# Patient Record
Sex: Female | Born: 1965 | Race: Black or African American | Hispanic: No | Marital: Single | Smoking: Never smoker
Health system: Southern US, Community
[De-identification: ages and names within clinical notes are randomized; demographics above are authoritative.]

## PROBLEM LIST (undated history)

## (undated) DIAGNOSIS — K219 Gastro-esophageal reflux disease without esophagitis: Secondary | ICD-10-CM

## (undated) DIAGNOSIS — J329 Chronic sinusitis, unspecified: Secondary | ICD-10-CM

## (undated) DIAGNOSIS — D649 Anemia, unspecified: Secondary | ICD-10-CM

## (undated) DIAGNOSIS — Z1371 Encounter for nonprocreative screening for genetic disease carrier status: Secondary | ICD-10-CM

## (undated) DIAGNOSIS — K449 Diaphragmatic hernia without obstruction or gangrene: Secondary | ICD-10-CM

## (undated) DIAGNOSIS — R053 Chronic cough: Secondary | ICD-10-CM

## (undated) DIAGNOSIS — Z803 Family history of malignant neoplasm of breast: Secondary | ICD-10-CM

## (undated) HISTORY — DX: Chronic sinusitis, unspecified: J32.9

## (undated) HISTORY — DX: Encounter for nonprocreative screening for genetic disease carrier status: Z13.71

## (undated) HISTORY — DX: Anemia, unspecified: D64.9

## (undated) HISTORY — DX: Chronic cough: R05.3

## (undated) HISTORY — DX: Gastro-esophageal reflux disease without esophagitis: K21.9

## (undated) HISTORY — DX: Family history of malignant neoplasm of breast: Z80.3

## (undated) HISTORY — PX: DILATION AND CURETTAGE OF UTERUS: SHX78

## (undated) HISTORY — DX: Diaphragmatic hernia without obstruction or gangrene: K44.9

## (undated) HISTORY — PX: BREAST CYST ASPIRATION: SHX578

---

## 2000-10-28 ENCOUNTER — Ambulatory Visit (HOSPITAL_COMMUNITY): Admission: RE | Admit: 2000-10-28 | Discharge: 2000-10-28 | Payer: Self-pay | Admitting: Obstetrics and Gynecology

## 2000-10-28 ENCOUNTER — Encounter: Payer: Self-pay | Admitting: Obstetrics and Gynecology

## 2000-12-24 ENCOUNTER — Encounter: Payer: Self-pay | Admitting: Obstetrics and Gynecology

## 2000-12-24 ENCOUNTER — Ambulatory Visit (HOSPITAL_COMMUNITY): Admission: RE | Admit: 2000-12-24 | Discharge: 2000-12-24 | Payer: Self-pay | Admitting: Obstetrics and Gynecology

## 2000-12-30 ENCOUNTER — Encounter: Payer: Self-pay | Admitting: Obstetrics and Gynecology

## 2000-12-30 ENCOUNTER — Inpatient Hospital Stay (HOSPITAL_COMMUNITY): Admission: AD | Admit: 2000-12-30 | Discharge: 2000-12-30 | Payer: Self-pay | Admitting: Obstetrics and Gynecology

## 2001-01-05 ENCOUNTER — Encounter: Payer: Self-pay | Admitting: Obstetrics and Gynecology

## 2001-01-05 ENCOUNTER — Ambulatory Visit (HOSPITAL_COMMUNITY): Admission: RE | Admit: 2001-01-05 | Discharge: 2001-01-05 | Payer: Self-pay | Admitting: Obstetrics and Gynecology

## 2001-01-08 ENCOUNTER — Inpatient Hospital Stay (HOSPITAL_COMMUNITY): Admission: AD | Admit: 2001-01-08 | Discharge: 2001-01-08 | Payer: Self-pay | Admitting: Obstetrics and Gynecology

## 2001-01-19 ENCOUNTER — Encounter: Payer: Self-pay | Admitting: Obstetrics and Gynecology

## 2001-01-19 ENCOUNTER — Ambulatory Visit (HOSPITAL_COMMUNITY): Admission: RE | Admit: 2001-01-19 | Discharge: 2001-01-19 | Payer: Self-pay | Admitting: Obstetrics and Gynecology

## 2001-02-11 ENCOUNTER — Encounter: Payer: Self-pay | Admitting: Obstetrics and Gynecology

## 2001-02-11 ENCOUNTER — Ambulatory Visit (HOSPITAL_COMMUNITY): Admission: RE | Admit: 2001-02-11 | Discharge: 2001-02-11 | Payer: Self-pay | Admitting: Obstetrics and Gynecology

## 2001-03-04 ENCOUNTER — Encounter: Payer: Self-pay | Admitting: Obstetrics and Gynecology

## 2001-03-04 ENCOUNTER — Ambulatory Visit (HOSPITAL_COMMUNITY): Admission: RE | Admit: 2001-03-04 | Discharge: 2001-03-04 | Payer: Self-pay | Admitting: Obstetrics and Gynecology

## 2001-03-17 ENCOUNTER — Inpatient Hospital Stay (HOSPITAL_COMMUNITY): Admission: AD | Admit: 2001-03-17 | Discharge: 2001-03-21 | Payer: Self-pay | Admitting: Obstetrics and Gynecology

## 2001-03-18 ENCOUNTER — Encounter: Payer: Self-pay | Admitting: Obstetrics and Gynecology

## 2009-10-26 ENCOUNTER — Emergency Department: Payer: Self-pay | Admitting: Emergency Medicine

## 2011-12-27 ENCOUNTER — Ambulatory Visit: Payer: Self-pay | Admitting: Unknown Physician Specialty

## 2012-04-14 ENCOUNTER — Ambulatory Visit: Payer: Self-pay

## 2012-05-13 ENCOUNTER — Ambulatory Visit: Payer: Self-pay

## 2013-01-14 HISTORY — PX: UPPER GI ENDOSCOPY: SHX6162

## 2014-04-07 ENCOUNTER — Encounter: Payer: Self-pay | Admitting: General Surgery

## 2014-04-07 ENCOUNTER — Ambulatory Visit (INDEPENDENT_AMBULATORY_CARE_PROVIDER_SITE_OTHER): Payer: 59 | Admitting: General Surgery

## 2014-04-07 VITALS — BP 124/68 | HR 70 | Resp 12 | Ht 62.0 in | Wt 145.0 lb

## 2014-04-07 DIAGNOSIS — Z1211 Encounter for screening for malignant neoplasm of colon: Secondary | ICD-10-CM | POA: Diagnosis not present

## 2014-04-07 MED ORDER — POLYETHYLENE GLYCOL 3350 17 GM/SCOOP PO POWD: ORAL | Status: DC

## 2014-04-07 NOTE — Patient Instructions (Addendum)
Colonoscopy A colonoscopy is an exam to look at the entire large intestine (colon). This exam can help find problems such as tumors, polyps, inflammation, and areas of bleeding. The exam takes about 1 hour.  LET [facility] CARE PROVIDER KNOW ABOUT:   Any allergies you have.  All medicines you are taking, including vitamins, herbs, eye drops, creams, and over-the-counter medicines.  Previous problems you or members of your family have had with the use of anesthetics.  Any blood disorders you have.  Previous surgeries you have had.  Medical conditions you have. RISKS AND COMPLICATIONS  Generally, this is a safe procedure. However, as with any procedure, complications can occur. Possible complications include:  Bleeding.  Tearing or rupture of the colon wall.  Reaction to medicines given during the exam.  Infection (rare). BEFORE THE PROCEDURE   Ask your health care provider about changing or stopping your regular medicines.  You may be prescribed an oral bowel prep. This involves drinking a large amount of medicated liquid, starting the day before your procedure. The liquid will cause you to have multiple loose stools until your stool is almost clear or light green. This cleans out your colon in preparation for the procedure.  Do not eat or drink anything else once you have started the bowel prep, unless your health care provider tells you it is safe to do so.  Arrange for someone to drive you home after the procedure. PROCEDURE   You will be given medicine to help you relax (sedative).  You will lie on your side with your knees bent.  A long, flexible tube with a light and camera on the end (colonoscope) will be inserted through the rectum and into the colon. The camera sends video back to a computer screen as it moves through the colon. The colonoscope also releases carbon dioxide gas to inflate the colon. This helps your health care provider see the area better.  During  the exam, your health care provider may take a small tissue sample (biopsy) to be examined under a microscope if any abnormalities are found.  The exam is finished when the entire colon has been viewed. AFTER THE PROCEDURE   Do not drive for 24 hours after the exam.  You may have a small amount of blood in your stool.  You may pass moderate amounts of gas and have mild abdominal cramping or bloating. This is caused by the gas used to inflate your colon during the exam.  Ask when your test results will be ready and how you will get your results. Make sure you get your test results. Document Released: 12/29/1999 Document Revised: 10/21/2012 Document Reviewed: 09/07/2012 [facility] Patient Information 2015 [place], [place]. This information is not intended to replace advice given to you by your health care provider. Make sure you discuss any questions you have with your health care provider.  Patient has been scheduled for a colonoscopy on 06-01-14 at [facility].

## 2014-04-07 NOTE — Progress Notes (Signed)
Patient ID: Sabrina Schneider, female   DOB: [birth year], 49 y.o.   MRN: [id number]  Chief Complaint  Patient presents with  . Colonoscopy    HPI Sabrina Schneider is a 49 y.o. female.  Here today to discuss having a colonoscopy. Patient states she has no GI problems at this time.  HPI  Past Medical History  Diagnosis Date  . Hiatal hernia     Past Surgical History  Procedure Laterality Date  . Breast cyst aspiration  2010  . Cesarean section  03/18/2001  . Upper gi endoscopy  2015    Dr. Vira Agar    Family History  Problem Relation Age of Onset  . Hypertension Mother   . Diabetes Mother   . Breast cancer Daughter     58    Social History History  Substance Use Topics  . Smoking status: Never Smoker   . Smokeless tobacco: Not on file  . Alcohol Use: 0.0 oz/week    0 Standard drinks or equivalent per week     Comment: occasionally    No Known Allergies  Current Outpatient Prescriptions  Medication Sig Dispense Refill  . polyethylene glycol powder (GLYCOLAX/MIRALAX) powder 255 grams one bottle for colonoscopy prep 255 g 0   No current facility-administered medications for this visit.    Review of Systems Review of Systems  Constitutional: Negative.   Respiratory: Negative.   Cardiovascular: Negative.   Gastrointestinal: Negative.     Blood pressure 124/68, pulse 70, resp. rate 12, height 5\' 2"  (1.575 m), weight 145 lb (65.772 kg), last menstrual period 03/24/2014.  Physical Exam Physical Exam  Constitutional: She is oriented to person, place, and time. She appears well-developed and well-nourished.  Eyes: Conjunctivae are normal. No scleral icterus.  Neck: Neck supple.  Cardiovascular: Normal rate, regular rhythm and normal heart sounds.   Pulmonary/Chest: Effort normal and breath sounds normal.  Abdominal: There is no tenderness.  Neurological: She is alert and oriented to person, place, and time.  Skin: Skin is warm and dry.    Data Reviewed Dr. Lucianne Lei Dalen's  notes of 11/23/2013  Assessment    Candidate for screening colonoscopy.    Plan    The risks and benefits of screening endoscopy exams were reviewed. Potential for injury of the intestine with bleeding or perforation discussed. Preparation reviewed with the patient by the staff.      Patient has been scheduled for a colonoscopy on 06-01-14 at [facility]  PCP:  No Pcp Per Patient Ref: Dr Carollee Leitz 04/08/2014, 6:22 AM

## 2014-04-08 ENCOUNTER — Other Ambulatory Visit: Payer: Self-pay | Admitting: General Surgery

## 2014-04-08 ENCOUNTER — Encounter: Payer: Self-pay | Admitting: General Surgery

## 2014-04-08 DIAGNOSIS — Z1211 Encounter for screening for malignant neoplasm of colon: Secondary | ICD-10-CM | POA: Insufficient documentation

## 2014-05-26 ENCOUNTER — Telehealth: Payer: Self-pay | Admitting: *Deleted

## 2014-05-26 NOTE — Telephone Encounter (Signed)
Patient states she has had no medication changes since last office visit.   This patient states she has yet to pick up her Miralax prescription and has yet to pre-register. Patient was instructed to pre-register no later than tomorrow, 05-27-14.  We will proceed with colonoscopy that is scheduled for 06-01-14 at [facility]. Patient instructed to call if she has further questions.

## 2014-05-31 ENCOUNTER — Encounter: Payer: Self-pay | Admitting: *Deleted

## 2014-06-01 ENCOUNTER — Ambulatory Visit: Payer: 59 | Admitting: Anesthesiology

## 2014-06-01 ENCOUNTER — Ambulatory Visit
Admission: RE | Admit: 2014-06-01 | Discharge: 2014-06-01 | Disposition: A | Discharge status: Home / Self Care | Payer: 59 | Source: Ambulatory Visit | Attending: General Surgery | Admitting: General Surgery

## 2014-06-01 ENCOUNTER — Encounter
Admission: RE | Disposition: A | Discharge status: Home / Self Care | Payer: Self-pay | Source: Ambulatory Visit | Attending: General Surgery

## 2014-06-01 DIAGNOSIS — Z1211 Encounter for screening for malignant neoplasm of colon: Secondary | ICD-10-CM | POA: Insufficient documentation

## 2014-06-01 HISTORY — PX: COLONOSCOPY: SHX5424

## 2014-06-01 LAB — POCT PREGNANCY, URINE: PREG TEST UR: NEGATIVE

## 2014-06-01 SURGERY — COLONOSCOPY
Anesthesia: General

## 2014-06-01 MED ORDER — FENTANYL CITRATE (PF) 100 MCG/2ML IJ SOLN
INTRAMUSCULAR | Status: DC | PRN
  Administered 2014-06-01: 50 ug via INTRAVENOUS

## 2014-06-01 MED ORDER — PROPOFOL INFUSION 10 MG/ML OPTIME
INTRAVENOUS | Status: DC | PRN
  Administered 2014-06-01: 200 ug/kg/min via INTRAVENOUS

## 2014-06-01 MED ORDER — SODIUM CHLORIDE 0.45 % IV SOLN
INTRAVENOUS | Status: DC
  Administered 2014-06-01: 10:00:00 via INTRAVENOUS

## 2014-06-01 MED ORDER — SODIUM CHLORIDE 0.45 % IV SOLN
INTRAVENOUS | Status: DC
  Administered 2014-06-01: 11:00:00 via INTRAVENOUS

## 2014-06-01 MED ORDER — LIDOCAINE HCL (CARDIAC) 20 MG/ML IV SOLN
INTRAVENOUS | Status: DC | PRN
  Administered 2014-06-01: 60 mg via INTRAVENOUS

## 2014-06-01 MED ORDER — MIDAZOLAM HCL 2 MG/2ML IJ SOLN
INTRAMUSCULAR | Status: DC | PRN
  Administered 2014-06-01 (×2): 1 mg via INTRAVENOUS

## 2014-06-01 MED ORDER — PROPOFOL 10 MG/ML IV BOLUS
INTRAVENOUS | Status: DC | PRN
  Administered 2014-06-01: 60 mg via INTRAVENOUS

## 2014-06-01 MED ORDER — PHENYLEPHRINE HCL 10 MG/ML IJ SOLN
INTRAMUSCULAR | Status: DC | PRN
  Administered 2014-06-01: 200 ug via INTRAVENOUS

## 2014-06-01 NOTE — Transfer of Care (Signed)
Immediate Anesthesia Transfer of Care Note  Patient: Sabrina Schneider  Procedure(s) Performed: Procedure(s): COLONOSCOPY (N/A)  Patient Location: PACU  Anesthesia Type:General  Level of Consciousness: awake, alert , oriented and patient cooperative  Airway & Oxygen Therapy: Patient Spontanous Breathing and Patient connected to nasal cannula oxygen  Post-op Assessment: Report given to RN, Post -op Vital signs reviewed and stable and Patient moving all extremities X 4  Post vital signs: Reviewed and stable  Last Vitals:  Filed Vitals:   06/01/14 1107  BP: 85/46  Pulse: 91  Temp: 35.8 C  Resp: 14    Complications: No apparent anesthesia complications

## 2014-06-01 NOTE — Anesthesia Postprocedure Evaluation (Signed)
  Anesthesia Post-op Note  Patient: Sabrina Schneider  Procedure(s) Performed: Procedure(s): COLONOSCOPY (N/A)  Anesthesia type:General  Patient location: PACU  Post pain: Pain level controlled  Post assessment: Post-op Vital signs reviewed, Patient's Cardiovascular Status Stable, Respiratory Function Stable, Patent Airway and No signs of Nausea or vomiting  Post vital signs: Reviewed and stable  Last Vitals:  Filed Vitals:   06/01/14 1110  BP: 85/46  Pulse: 92  Temp:   Resp: 15    Level of consciousness: awake, alert  and patient cooperative  Complications: No apparent anesthesia complications

## 2014-06-01 NOTE — Discharge Instructions (Signed)
Resume your regular diet. No driving until tomorrow.

## 2014-06-01 NOTE — Op Note (Signed)
[facility] Patient Name: Sabrina Schneider Procedure Date: 06/01/2014 10:28 AM MRN: [id number] Account #: [id number] Date of Birth: 1965-12-10 Admit Type: Outpatient Age: 49 Room: [facility] ENDO ROOM 1 Gender: Female Note Status: Finalized Procedure:         Colonoscopy Indications:       Screening for colorectal malignant neoplasm Providers:         Robert Bellow, MD Referring MD:      No Local Md, MD (Referring MD) Medicines:         Monitored Anesthesia Care Complications:     No immediate complications. Procedure:         Pre-Anesthesia Assessment:                    - Prior to the procedure, a History and Physical was                     performed, and patient medications, allergies and                     sensitivities were reviewed. The patient's tolerance of                     previous anesthesia was reviewed.                    - The risks and benefits of the procedure and the sedation                     options and risks were discussed with the patient. All                     questions were answered and informed consent was obtained.                    After obtaining informed consent, the colonoscope was                     passed under direct vision. Throughout the procedure, the                     patient's blood pressure, pulse, and oxygen saturations                     were monitored continuously. The Colonoscope was                     introduced through the anus and advanced to the the cecum,                     identified by the appendiceal orifice, IC valve and                     transillumination. The colonoscopy was performed without                     difficulty. The patient tolerated the procedure well. The                     quality of the bowel preparation was excellent. Findings:      The entire examined colon appeared normal on direct and retroflexion       views. Impression:        - The entire examined colon is  normal on direct and  retroflexion views.                    - No specimens collected. Recommendation:    - Repeat colonoscopy in 10 years for screening purposes. Procedure Code(s): --- Professional ---                    [phone], Colonoscopy, flexible; diagnostic, including                     collection of specimen(s) by brushing or washing, when                     performed (separate procedure) Diagnosis Code(s): --- Professional ---                    Z12.11, Encounter for screening for malignant neoplasm of                     colon CPT copyright 2014 American Medical Association. All rights reserved. The codes documented in this report are preliminary and upon coder review may  be revised to meet current compliance requirements. Robert Bellow, MD 06/01/2014 11:00:51 AM This report has been signed electronically. Number of Addenda: 0 Note Initiated On: 06/01/2014 10:28 AM Scope Withdrawal Time: 0 hours 6 minutes 3 seconds  Total Procedure Duration: 0 hours 15 minutes 24 seconds       [facility]

## 2014-06-01 NOTE — Anesthesia Preprocedure Evaluation (Signed)
Anesthesia Evaluation  Patient identified by MRN, date of birth, ID band Patient awake    Reviewed: Allergy & Precautions  History of Anesthesia Complications Negative for: history of anesthetic complications  Airway Mallampati: II       Dental no notable dental hx. (+) Chipped   Pulmonary neg pulmonary ROS,          Cardiovascular Exercise Tolerance: Good negative cardio ROS Normal cardiovascular exam    Neuro/Psych negative neurological ROS     GI/Hepatic hiatal hernia,   Endo/Other  negative endocrine ROS  Renal/GU negative Renal ROS  negative genitourinary   Musculoskeletal negative musculoskeletal ROS (+)   Abdominal Normal abdominal exam  (+)   Peds negative pediatric ROS (+)  Hematology negative hematology ROS (+)   Anesthesia Other Findings   Reproductive/Obstetrics negative OB ROS                             Anesthesia Physical Anesthesia Plan  ASA: I  Anesthesia Plan: General   Post-op Pain Management:    Induction: Intravenous  Airway Management Planned: Nasal Cannula  Additional Equipment:   Intra-op Plan:   Post-operative Plan:   Informed Consent: I have reviewed the patients History and Physical, chart, labs and discussed the procedure including the risks, benefits and alternatives for the proposed anesthesia with the patient or authorized representative who has indicated his/her understanding and acceptance.     Plan Discussed with: CRNA and Surgeon  Anesthesia Plan Comments:         Anesthesia Quick Evaluation

## 2014-06-01 NOTE — H&P (Signed)
HPI Sabrina Schneider is a 49 y.o. female. Here today to discuss having a colonoscopy. Patient states she has no GI problems at this time.  HPI  Past Medical History  Diagnosis Date  . Hiatal hernia     Past Surgical History  Procedure Laterality Date  . Breast cyst aspiration  2010  . Cesarean section  03/18/2001  . Upper gi endoscopy  2015    Dr. Vira Agar    Family History  Problem Relation Age of Onset  . Hypertension Mother   . Diabetes Mother   . Breast cancer Daughter     7    Social History History  Substance Use Topics  . Smoking status: Never Smoker   . Smokeless tobacco: Not on file  . Alcohol Use: 0.0 oz/week    0 Standard drinks or equivalent per week     Comment: occasionally    No Known Allergies  Current Outpatient Prescriptions  Medication Sig Dispense Refill  . polyethylene glycol powder (GLYCOLAX/MIRALAX) powder 255 grams one bottle for colonoscopy prep 255 g 0   No current facility-administered medications for this visit.    Review of Systems Review of Systems  Constitutional: Negative.  Respiratory: Negative.  Cardiovascular: Negative.  Gastrointestinal: Negative.    Blood pressure 124/68, pulse 70, resp. rate 12, height 5\' 2"  (1.575 m), weight 145 lb (65.772 kg), last menstrual period 03/24/2014.  Physical Exam Physical Exam  Constitutional: She is oriented to person, place, and time. She appears well-developed and well-nourished.  Eyes: Conjunctivae are normal. No scleral icterus.  Neck: Neck supple.  Cardiovascular: Normal rate, regular rhythm and normal heart sounds.  Pulmonary/Chest: Effort normal and breath sounds normal.  Abdominal: There is no tenderness.  Neurological: She is alert and oriented to person, place, and time.  Skin: Skin is warm and dry.    Data Reviewed Dr. Lucianne Lei Dalen's notes of 11/23/2013  Assessment    Candidate for  screening colonoscopy.    Plan    The risks and benefits of screening endoscopy exams were reviewed. Potential for injury of the intestine with bleeding or perforation discussed. Preparation reviewed with the patient by the staff.     Patient has been scheduled for a colonoscopy on 06-01-14 at [facility].  PCP: No Pcp Per Patient Ref: Dr Kerin Ransom, [place]     HPI Sabrina Schneider is a 49 y.o. female. Here today to discuss having a colonoscopy. Patient states she has no GI problems at this time.  HPI  Past Medical History  Diagnosis Date  . Hiatal hernia     Past Surgical History  Procedure Laterality Date  . Breast cyst aspiration  2010  . Cesarean section  03/18/2001  . Upper gi endoscopy  2015    Dr. Vira Agar    Family History  Problem Relation Age of Onset  . Hypertension Mother   . Diabetes Mother   . Breast cancer Daughter     52    Social History History  Substance Use Topics  . Smoking status: Never Smoker   . Smokeless tobacco: Not on file  . Alcohol Use: 0.0 oz/week    0 Standard drinks or equivalent per week     Comment: occasionally    No Known Allergies  Current Outpatient Prescriptions  Medication Sig Dispense Refill  . polyethylene glycol powder (GLYCOLAX/MIRALAX) powder 255 grams one bottle for colonoscopy prep 255 g 0   No current facility-administered medications for this visit.  Review of Systems Review of Systems  Constitutional: Negative.  Respiratory: Negative.  Cardiovascular: Negative.  Gastrointestinal: Negative.    Blood pressure 124/68, pulse 70, resp. rate 12, height 5\' 2"  (1.575 m), weight 145 lb (65.772 kg), last menstrual period 03/24/2014.  Physical Exam Physical Exam  Constitutional: She is oriented to person, place, and time. She appears well-developed and well-nourished.  Eyes: Conjunctivae are normal. No scleral icterus.   Neck: Neck supple.  Cardiovascular: Normal rate, regular rhythm and normal heart sounds.  Pulmonary/Chest: Effort normal and breath sounds normal.  Abdominal: There is no tenderness.  Neurological: She is alert and oriented to person, place, and time.  Skin: Skin is warm and dry.    Data Reviewed Dr. Lucianne Lei Dalen's notes of 11/23/2013  Assessment    Candidate for screening colonoscopy.    Plan    The risks and benefits of screening endoscopy exams were reviewed. Potential for injury of the intestine with bleeding or perforation discussed. Preparation reviewed with the patient by the staff.      Tolerated prep well. No change in health status. Lungs: Clear. Cardio: RR. Plan: Colonoscopy.   Robert Bellow

## 2014-06-07 ENCOUNTER — Encounter: Payer: Self-pay | Admitting: General Surgery

## 2015-11-07 ENCOUNTER — Encounter: Payer: Self-pay | Admitting: Internal Medicine

## 2015-11-07 ENCOUNTER — Encounter (INDEPENDENT_AMBULATORY_CARE_PROVIDER_SITE_OTHER): Payer: Self-pay

## 2015-11-07 ENCOUNTER — Ambulatory Visit (INDEPENDENT_AMBULATORY_CARE_PROVIDER_SITE_OTHER): Payer: 59 | Admitting: Internal Medicine

## 2015-11-07 VITALS — BP 124/86 | HR 79 | Temp 98.4°F | Ht 63.0 in | Wt 154.2 lb

## 2015-11-07 DIAGNOSIS — Z13220 Encounter for screening for lipoid disorders: Secondary | ICD-10-CM

## 2015-11-07 DIAGNOSIS — K219 Gastro-esophageal reflux disease without esophagitis: Secondary | ICD-10-CM

## 2015-11-07 DIAGNOSIS — N390 Urinary tract infection, site not specified: Secondary | ICD-10-CM

## 2015-11-07 DIAGNOSIS — E559 Vitamin D deficiency, unspecified: Secondary | ICD-10-CM

## 2015-11-07 DIAGNOSIS — Z1211 Encounter for screening for malignant neoplasm of colon: Secondary | ICD-10-CM

## 2015-11-07 MED ORDER — OMEPRAZOLE 20 MG PO CPDR: 20.0000 mg | DELAYED_RELEASE_CAPSULE | Freq: Every day | ORAL | 3 refills | Status: DC

## 2015-11-07 MED ORDER — TRIAMCINOLONE ACETONIDE 0.1 % EX CREA: 1.0000 "application " | TOPICAL_CREAM | Freq: Two times a day (BID) | CUTANEOUS | 0 refills | Status: DC

## 2015-11-07 NOTE — Progress Notes (Signed)
Patient ID: Sabrina Schneider, female   DOB: 14-Jul-1965, 50 y.o.   MRN: [id number]   Subjective:    Patient ID: Sabrina Schneider, female    DOB: August 25, 1965, 50 y.o.   MRN: [id number]  HPI  Patient here to establish care.  Previously seeing Dr Vernie Ammons.  She has a history of reflux.  Takes omeprazole prn.  May have flare two times per month.  Has a hiatal hernia.  She is walking.  No chest pain.  No sob.  No abdominal pain or cramping.  Has a history of vitamin D Defiency.  Also has a history of reoccurring UTIs.  No urinary symptoms now.  Bowels stable.  Colonoscopy 05/2014.  Ok.    Past Medical History:  Diagnosis Date  . GERD (gastroesophageal reflux disease)   . Hiatal hernia    Past Surgical History:  Procedure Laterality Date  . BREAST CYST ASPIRATION  2010   Right breast cyst aspiration  . CESAREAN SECTION  03/18/2001  . COLONOSCOPY N/A 06/01/2014   Procedure: COLONOSCOPY;  Surgeon: Robert Bellow, MD;  Location: [facility] ENDOSCOPY;  Service: Endoscopy;  Laterality: N/A;  . UPPER GI ENDOSCOPY  2015   Dr. Vira Agar   Family History  Problem Relation Age of Onset  . Hypertension Mother   . Diabetes Mother   . Breast cancer Daughter     38   Social History   Social History  . Marital status: Single    Spouse name: N/A  . Number of children: N/A  . Years of education: N/A   Social History Main Topics  . Smoking status: Never Smoker  . Smokeless tobacco: Never Used  . Alcohol use 0.0 oz/week     Comment: occasionally  . Drug use: No  . Sexual activity: Not Asked   Other Topics Concern  . None   Social History Narrative  . None    Outpatient Encounter Prescriptions as of 11/07/2015  Medication Sig  . omeprazole (PRILOSEC) 20 MG capsule Take 1 capsule (20 mg total) by mouth daily.  Marland Kitchen triamcinolone cream (KENALOG) 0.1 % Apply 1 application topically 2 (two) times daily.  . [DISCONTINUED] acetaminophen (TYLENOL) 500 MG tablet Take 1,000 mg by mouth every 6 (six) hours as  needed for moderate pain or headache.  . [DISCONTINUED] cholecalciferol (VITAMIN D) 1000 UNITS tablet Take 1,000 Units by mouth daily.   No facility-administered encounter medications on file as of 11/07/2015.     Review of Systems  Constitutional: Negative for fatigue and unexpected weight change.  HENT: Negative for congestion and sinus pressure.   Respiratory: Negative for cough, chest tightness and shortness of breath.   Cardiovascular: Negative for chest pain, palpitations and leg swelling.  Gastrointestinal: Negative for abdominal pain, diarrhea, nausea and vomiting.  Genitourinary: Negative for difficulty urinating and dysuria.  Musculoskeletal: Negative for back pain and joint swelling.  Skin: Negative for color change and rash.  Neurological: Negative for dizziness, light-headedness and headaches.  Psychiatric/Behavioral: Negative for agitation and dysphoric mood.       Objective:    Physical Exam  Constitutional: She appears well-developed and well-nourished. No distress.  HENT:  Nose: Nose normal.  Mouth/Throat: Oropharynx is clear and moist.  Neck: Neck supple. No thyromegaly present.  Cardiovascular: Normal rate and regular rhythm.   Pulmonary/Chest: Breath sounds normal. No respiratory distress. She has no wheezes.  Abdominal: Soft. Bowel sounds are normal. There is no tenderness.  Musculoskeletal: She exhibits no edema or tenderness.  Lymphadenopathy:  She has no cervical adenopathy.  Skin: No rash noted. No erythema.  Psychiatric: She has a normal mood and affect. Her behavior is normal.    BP 124/86   Pulse 79   Temp 98.4 F (36.9 C) (Oral)   Ht 5\' 3"  (1.6 m)   Wt 154 lb 3.2 oz (69.9 kg)   LMP 11/05/2015 (Exact Date)   SpO2 97%   BMI 27.32 kg/m  Wt Readings from Last 3 Encounters:  11/07/15 154 lb 3.2 oz (69.9 kg)  06/01/14 145 lb (65.8 kg)  04/07/14 145 lb (65.8 kg)       Assessment & Plan:   Problem List Items Addressed This Visit     Encounter for screening colonoscopy    Had colonoscopy 05/2014.  Recommended f/u colonoscopy in 10 years.        Frequent UTI    No urinary symptoms currently.  Follow.        GERD (gastroesophageal reflux disease)    Symptoms as outlined.  Have her take omeprazole daily.  Follow.        Relevant Medications   omeprazole (PRILOSEC) 20 MG capsule   Other Relevant Orders   CBC with Differential/Platelet (Completed)   Comprehensive metabolic panel (Completed)   TSH (Completed)   Vitamin D deficiency    Recheck vitamin D level.  Follow.        Relevant Orders   VITAMIN D 25 Hydroxy (Vit-D Deficiency, Fractures) (Completed)    Other Visit Diagnoses    Screening cholesterol level    -  Primary   Relevant Orders   Lipid panel (Completed)       Einar Pheasant, MD

## 2015-11-07 NOTE — Progress Notes (Signed)
Pre visit review using our clinic review tool, if applicable. No additional management support is needed unless otherwise documented below in the visit note. 

## 2015-11-08 LAB — TSH: TSH: 1.79 u[IU]/mL (ref 0.450–4.500)

## 2015-11-08 LAB — CBC WITH DIFFERENTIAL/PLATELET
BASOS: 0 %
Basophils Absolute: 0 10*3/uL (ref 0.0–0.2)
EOS (ABSOLUTE): 0.2 10*3/uL (ref 0.0–0.4)
Eos: 4 %
Hematocrit: 34.8 % (ref 34.0–46.6)
Hemoglobin: 11.8 g/dL (ref 11.1–15.9)
Immature Grans (Abs): 0 10*3/uL (ref 0.0–0.1)
Immature Granulocytes: 0 %
LYMPHS ABS: 1.6 10*3/uL (ref 0.7–3.1)
Lymphs: 36 %
MCH: 32.2 pg (ref 26.6–33.0)
MCHC: 33.9 g/dL (ref 31.5–35.7)
MCV: 95 fL (ref 79–97)
MONOS ABS: 0.5 10*3/uL (ref 0.1–0.9)
Monocytes: 11 %
NEUTROS ABS: 2.1 10*3/uL (ref 1.4–7.0)
Neutrophils: 49 %
PLATELETS: 269 10*3/uL (ref 150–379)
RBC: 3.66 x10E6/uL — ABNORMAL LOW (ref 3.77–5.28)
RDW: 13.3 % (ref 12.3–15.4)
WBC: 4.3 10*3/uL (ref 3.4–10.8)

## 2015-11-08 LAB — COMPREHENSIVE METABOLIC PANEL
A/G RATIO: 1.4 (ref 1.2–2.2)
ALT: 14 IU/L (ref 0–32)
AST: 17 IU/L (ref 0–40)
Albumin: 3.9 g/dL (ref 3.5–5.5)
Alkaline Phosphatase: 67 IU/L (ref 39–117)
BUN/Creatinine Ratio: 15 (ref 9–23)
BUN: 11 mg/dL (ref 6–24)
Bilirubin Total: 0.4 mg/dL (ref 0.0–1.2)
CHLORIDE: 107 mmol/L — AB (ref 96–106)
CO2: 24 mmol/L (ref 18–29)
Calcium: 8.7 mg/dL (ref 8.7–10.2)
Creatinine, Ser: 0.74 mg/dL (ref 0.57–1.00)
GFR calc non Af Amer: 93 mL/min/{1.73_m2} (ref >59)
GFR, EST AFRICAN AMERICAN: 108 mL/min/{1.73_m2} (ref >59)
Globulin, Total: 2.8 g/dL (ref 1.5–4.5)
Glucose: 84 mg/dL (ref 65–99)
POTASSIUM: 4.4 mmol/L (ref 3.5–5.2)
Sodium: 141 mmol/L (ref 134–144)
TOTAL PROTEIN: 6.7 g/dL (ref 6.0–8.5)

## 2015-11-08 LAB — LIPID PANEL
CHOL/HDL RATIO: 2.6 ratio (ref 0.0–4.4)
CHOLESTEROL TOTAL: 145 mg/dL (ref 100–199)
HDL: 55 mg/dL (ref >39)
LDL CALC: 72 mg/dL (ref 0–99)
Triglycerides: 88 mg/dL (ref 0–149)
VLDL Cholesterol Cal: 18 mg/dL (ref 5–40)

## 2015-11-08 LAB — VITAMIN D 25 HYDROXY (VIT D DEFICIENCY, FRACTURES): Vit D, 25-Hydroxy: 15 ng/mL — ABNORMAL LOW (ref 30.0–100.0)

## 2015-11-09 ENCOUNTER — Telehealth: Payer: Self-pay | Admitting: Internal Medicine

## 2015-11-09 DIAGNOSIS — R739 Hyperglycemia, unspecified: Secondary | ICD-10-CM

## 2015-11-09 NOTE — Telephone Encounter (Signed)
Orders placed for f/u glucose and a1c to be drawn at [facility].

## 2015-11-10 ENCOUNTER — Telehealth: Payer: Self-pay

## 2015-11-10 MED ORDER — VITAMIN D (ERGOCALCIFEROL) 1.25 MG (50000 UNIT) PO CAPS: 50000.0000 [IU] | ORAL_CAPSULE | ORAL | 0 refills | Status: AC

## 2015-11-10 NOTE — Telephone Encounter (Signed)
-----   Message from Einar Pheasant, MD sent at 11/09/2015  5:23 AM EDT ----- Notify pt that her cholesterol looks good.  Her vitamin D level is low.  Is she taking any vitamin D supplements.  If no, then I would like for her to take prescription vitamin D (ergocalciferol) 50,000 units q week x 8 weeks and then start vitamin D3 2000 units q day.  We will follow level.  I can send in rx if she is agreeable to start.  Her sugar is slightly increased.  I would like to recheck her sugar in 3-4 weeks.  I will place the order for her to have drawn at [facility].  Needs to go by and have drawn in 3-4 weeks.  Kidney function tests, thyroid test and liver function tests are wnl.

## 2015-11-12 ENCOUNTER — Encounter: Payer: Self-pay | Admitting: Internal Medicine

## 2015-11-12 NOTE — Assessment & Plan Note (Signed)
Had colonoscopy 05/2014.  Recommended f/u colonoscopy in 10 years.

## 2015-11-12 NOTE — Assessment & Plan Note (Signed)
Symptoms as outlined.  Have her take omeprazole daily.  Follow.

## 2015-11-12 NOTE — Assessment & Plan Note (Signed)
No urinary symptoms currently.  Follow.

## 2015-11-12 NOTE — Assessment & Plan Note (Signed)
Recheck vitamin D level.  Follow.

## 2015-11-28 ENCOUNTER — Encounter: Payer: Self-pay | Admitting: Internal Medicine

## 2015-11-28 LAB — HEMOGLOBIN A1C
ESTIMATED AVERAGE GLUCOSE: 103 mg/dL
HEMOGLOBIN A1C: 5.2 % (ref 4.8–5.6)

## 2015-11-28 LAB — GLUCOSE, FASTING: Glucose, Plasma: 95 mg/dL (ref 65–99)

## 2015-11-29 NOTE — Telephone Encounter (Signed)
See pts my chart message.  She is questioning her vitamin D prescription.  It appears the rx was sent in for 8 tablets.  Please confirm with pharmacy why pt only given 4 tablets.  If was because of insurance, then they should have rx for for more tablets.  If needs a new rx, ok to send in for four more tablets.  Please also let pt know.

## 2016-02-06 ENCOUNTER — Encounter: Payer: Self-pay | Admitting: Internal Medicine

## 2016-02-06 ENCOUNTER — Ambulatory Visit (INDEPENDENT_AMBULATORY_CARE_PROVIDER_SITE_OTHER): Payer: 59

## 2016-02-06 ENCOUNTER — Ambulatory Visit (INDEPENDENT_AMBULATORY_CARE_PROVIDER_SITE_OTHER): Payer: 59 | Admitting: Internal Medicine

## 2016-02-06 VITALS — BP 118/78 | HR 72 | Temp 98.1°F | Resp 16 | Ht 63.0 in | Wt 153.4 lb

## 2016-02-06 DIAGNOSIS — R109 Unspecified abdominal pain: Secondary | ICD-10-CM | POA: Diagnosis not present

## 2016-02-06 DIAGNOSIS — M5441 Lumbago with sciatica, right side: Secondary | ICD-10-CM

## 2016-02-06 DIAGNOSIS — K219 Gastro-esophageal reflux disease without esophagitis: Secondary | ICD-10-CM | POA: Diagnosis not present

## 2016-02-06 DIAGNOSIS — M47816 Spondylosis without myelopathy or radiculopathy, lumbar region: Secondary | ICD-10-CM | POA: Diagnosis not present

## 2016-02-06 DIAGNOSIS — R8290 Unspecified abnormal findings in urine: Secondary | ICD-10-CM

## 2016-02-06 DIAGNOSIS — G8929 Other chronic pain: Secondary | ICD-10-CM

## 2016-02-06 DIAGNOSIS — M545 Low back pain, unspecified: Secondary | ICD-10-CM | POA: Insufficient documentation

## 2016-02-06 DIAGNOSIS — R140 Abdominal distension (gaseous): Secondary | ICD-10-CM

## 2016-02-06 LAB — POCT URINE PREGNANCY: Preg Test, Ur: NEGATIVE

## 2016-02-06 MED ORDER — RABEPRAZOLE SODIUM 20 MG PO TBEC: 20.0000 mg | DELAYED_RELEASE_TABLET | Freq: Every day | ORAL | 1 refills | Status: DC

## 2016-02-06 NOTE — Progress Notes (Signed)
Patient ID: Sabrina Schneider, female   DOB: [birth year], 51 y.o.   MRN: [id number]   Subjective:    Patient ID: Sabrina Schneider, female    DOB: Feb 07, 1965, 51 y.o.   MRN: [id number]  HPI  Patient here for a scheduled follow up.  Was seen by me as a new pt in 10/2015.  Was instructed to take PPI daily secondary to increased acid reflux.  She comes in for f/u today.  States she took the medication for a while.  Acid reflux better.  Was concerned that the medication was causing increased gas.  Stopped the medication.  Still with increased gas and bloating.  Some right lower back pain and occasionally will notice in her right lower abdomen.  Occasionally will go down her leg if she walks a lot.  Saw chiropractor.  Bowels are moving. She is eating.  No vomiting.     Past Medical History:  Diagnosis Date  . GERD (gastroesophageal reflux disease)   . Hiatal hernia    Past Surgical History:  Procedure Laterality Date  . BREAST CYST ASPIRATION  2010   Right breast cyst aspiration  . CESAREAN SECTION  03/18/2001  . COLONOSCOPY N/A 06/01/2014   Procedure: COLONOSCOPY;  Surgeon: Robert Bellow, MD;  Location: [facility] ENDOSCOPY;  Service: Endoscopy;  Laterality: N/A;  . UPPER GI ENDOSCOPY  2015   Dr. Vira Agar   Family History  Problem Relation Age of Onset  . Hypertension Mother   . Diabetes Mother   . Breast cancer Daughter     51   Social History   Social History  . Marital status: Single    Spouse name: N/A  . Number of children: N/A  . Years of education: N/A   Social History Main Topics  . Smoking status: Never Smoker  . Smokeless tobacco: Never Used  . Alcohol use 0.0 oz/week     Comment: occasionally  . Drug use: No  . Sexual activity: Not Asked   Other Topics Concern  . None   Social History Narrative  . None    Outpatient Encounter Prescriptions as of 02/06/2016  Medication Sig  . triamcinolone cream (KENALOG) 0.1 % Apply 1 application topically 2 (two) times daily.  .  [DISCONTINUED] omeprazole (PRILOSEC) 20 MG capsule Take 1 capsule (20 mg total) by mouth daily.  . RABEprazole (ACIPHEX) 20 MG tablet Take 1 tablet (20 mg total) by mouth daily.   No facility-administered encounter medications on file as of 02/06/2016.     Review of Systems  Constitutional: Negative for appetite change, fever and unexpected weight change.  HENT: Negative for congestion and sinus pressure.   Respiratory: Negative for cough, chest tightness and shortness of breath.   Cardiovascular: Negative for chest pain, palpitations and leg swelling.  Gastrointestinal: Positive for abdominal pain. Negative for vomiting.       Bloating and gas.    Genitourinary: Negative for difficulty urinating and dysuria.  Musculoskeletal: Positive for back pain. Negative for joint swelling.  Skin: Negative for color change and rash.  Neurological: Negative for dizziness and headaches.  Psychiatric/Behavioral: Negative for agitation and dysphoric mood.       Objective:    Physical Exam  Constitutional: She appears well-developed and well-nourished. No distress.  HENT:  Nose: Nose normal.  Mouth/Throat: Oropharynx is clear and moist.  Neck: Neck supple. No thyromegaly present.  Cardiovascular: Normal rate and regular rhythm.   Pulmonary/Chest: Breath sounds normal. No respiratory distress. She has no wheezes.  Abdominal: Soft. Bowel sounds are normal.  No increased tenderness to palpation.    Musculoskeletal: She exhibits no edema or tenderness.  Lymphadenopathy:    She has no cervical adenopathy.  Skin: No rash noted. No erythema.  Psychiatric: She has a normal mood and affect. Her behavior is normal.    BP 118/78   Pulse 72   Temp 98.1 F (36.7 C) (Oral)   Resp 16   Ht 5\' 3"  (1.6 m)   Wt 153 lb 6 oz (69.6 kg)   LMP 01/22/2016   SpO2 97%   BMI 27.17 kg/m  Wt Readings from Last 3 Encounters:  02/06/16 153 lb 6 oz (69.6 kg)  11/07/15 154 lb 3.2 oz (69.9 kg)  06/01/14 145 lb  (65.8 kg)     Lab Results  Component Value Date   WBC 4.6 02/06/2016   HCT 35.6 02/06/2016   PLT 293 02/06/2016   GLUCOSE 84 11/07/2015   CHOL 145 11/07/2015   TRIG 88 11/07/2015   HDL 55 11/07/2015   LDLCALC 72 11/07/2015   ALT 13 02/06/2016   AST 15 02/06/2016   NA 141 11/07/2015   K 4.4 11/07/2015   CL 107 (H) 11/07/2015   CREATININE 0.74 11/07/2015   BUN 11 11/07/2015   CO2 24 11/07/2015   TSH 1.790 11/07/2015   HGBA1C 5.2 11/27/2015       Assessment & Plan:   Problem List Items Addressed This Visit    Abdominal pain    Increased gas, bloating and abdominal discomfort.  aciphex as outlined.  Check labs as outlined.  CT abdomen and pelvis.  Get her back in soon to reassess.        Relevant Orders   POCT urine pregnancy (Completed)   CBC w/Diff (Completed)   Hepatic function panel (Completed)   Urinalysis, Routine w reflex microscopic (Completed)   Amylase (Completed)   Lipase (Completed)   CT Abdomen Pelvis W Contrast   GERD (gastroesophageal reflux disease)    Acid relfux still present.  Concern that prilosec was causing increased gas and bloating.  Off now.  Still with issues.  Will change to aciphex.  Check liver panel, cbc and amylase/lipase.  Follow.       Relevant Medications   RABEprazole (ACIPHEX) 20 MG tablet   Other Relevant Orders   Ambulatory referral to Gastroenterology   Low back pain    Persistent low back pain as outlined.  Will check cxr.  Tylenol.  Follow.        Relevant Orders   DG Lumbar Spine 2-3 Views (Completed)   CBC w/Diff (Completed)   Hepatic function panel (Completed)   Urinalysis, Routine w reflex microscopic (Completed)   Amylase (Completed)   Lipase (Completed)   Urine Culture (Completed)    Other Visit Diagnoses    Abnormal result on screening urine test    -  Primary   Relevant Orders   Urine Culture (Completed)   Ambulatory referral to Gastroenterology   Bloating       Relevant Orders   CT Abdomen Pelvis W  Contrast   Ambulatory referral to Gastroenterology       Einar Pheasant, MD

## 2016-02-06 NOTE — Progress Notes (Signed)
Pre-visit discussion using our clinic review tool. No additional management support is needed unless otherwise documented below in the visit note.  

## 2016-02-07 LAB — HEPATIC FUNCTION PANEL
ALT: 13 IU/L (ref 0–32)
AST: 15 IU/L (ref 0–40)
Albumin: 4.1 g/dL (ref 3.5–5.5)
Alkaline Phosphatase: 70 IU/L (ref 39–117)
Bilirubin Total: 0.3 mg/dL (ref 0.0–1.2)
Bilirubin, Direct: 0.09 mg/dL (ref 0.00–0.40)
Total Protein: 7.2 g/dL (ref 6.0–8.5)

## 2016-02-07 LAB — CBC WITH DIFFERENTIAL/PLATELET
Basophils Absolute: 0 10*3/uL (ref 0.0–0.2)
Basos: 0 %
EOS (ABSOLUTE): 0.1 10*3/uL (ref 0.0–0.4)
Eos: 3 %
Hematocrit: 35.6 % (ref 34.0–46.6)
Hemoglobin: 11.9 g/dL (ref 11.1–15.9)
Immature Grans (Abs): 0 10*3/uL (ref 0.0–0.1)
Immature Granulocytes: 0 %
Lymphocytes Absolute: 1.4 10*3/uL (ref 0.7–3.1)
Lymphs: 30 %
MCH: 30.5 pg (ref 26.6–33.0)
MCHC: 33.4 g/dL (ref 31.5–35.7)
MCV: 91 fL (ref 79–97)
MONOS ABS: 0.3 10*3/uL (ref 0.1–0.9)
Monocytes: 7 %
NEUTROS ABS: 2.8 10*3/uL (ref 1.4–7.0)
Neutrophils: 60 %
PLATELETS: 293 10*3/uL (ref 150–379)
RBC: 3.9 x10E6/uL (ref 3.77–5.28)
RDW: 13 % (ref 12.3–15.4)
WBC: 4.6 10*3/uL (ref 3.4–10.8)

## 2016-02-07 LAB — URINALYSIS, ROUTINE W REFLEX MICROSCOPIC
Bilirubin, UA: NEGATIVE
GLUCOSE, UA: NEGATIVE
KETONES UA: NEGATIVE
Leukocytes, UA: NEGATIVE
NITRITE UA: NEGATIVE
Protein, UA: NEGATIVE
Specific Gravity, UA: 1.019 (ref 1.005–1.030)
UUROB: 0.2 mg/dL (ref 0.2–1.0)
pH, UA: 6 (ref 5.0–7.5)

## 2016-02-07 LAB — MICROSCOPIC EXAMINATION: CASTS: NONE SEEN /LPF

## 2016-02-07 LAB — LIPASE: LIPASE: 15 U/L (ref 14–72)

## 2016-02-07 LAB — AMYLASE: Amylase: 95 U/L (ref 31–124)

## 2016-02-07 LAB — SPECIMEN STATUS REPORT

## 2016-02-08 ENCOUNTER — Encounter: Payer: Self-pay | Admitting: *Deleted

## 2016-02-08 DIAGNOSIS — M545 Low back pain, unspecified: Secondary | ICD-10-CM

## 2016-02-09 LAB — URINE CULTURE

## 2016-02-10 NOTE — Telephone Encounter (Signed)
Order placed for physical therapy referral.

## 2016-02-11 ENCOUNTER — Encounter: Payer: Self-pay | Admitting: Internal Medicine

## 2016-02-11 NOTE — Assessment & Plan Note (Signed)
Persistent low back pain as outlined.  Will check cxr.  Tylenol.  Follow.

## 2016-02-11 NOTE — Assessment & Plan Note (Signed)
Increased gas, bloating and abdominal discomfort.  aciphex as outlined.  Check labs as outlined.  CT abdomen and pelvis.  Get her back in soon to reassess.

## 2016-02-11 NOTE — Assessment & Plan Note (Signed)
Acid relfux still present.  Concern that prilosec was causing increased gas and bloating.  Off now.  Still with issues.  Will change to aciphex.  Check liver panel, cbc and amylase/lipase.  Follow.

## 2016-02-12 ENCOUNTER — Encounter: Payer: Self-pay | Admitting: *Deleted

## 2016-02-12 ENCOUNTER — Other Ambulatory Visit: Payer: Self-pay | Admitting: Internal Medicine

## 2016-02-12 ENCOUNTER — Telehealth: Payer: Self-pay | Admitting: *Deleted

## 2016-02-12 DIAGNOSIS — R319 Hematuria, unspecified: Secondary | ICD-10-CM

## 2016-02-12 DIAGNOSIS — R109 Unspecified abdominal pain: Secondary | ICD-10-CM

## 2016-02-12 MED ORDER — FLUCONAZOLE 150 MG PO TABS: ORAL_TABLET | ORAL | 0 refills | Status: DC

## 2016-02-12 NOTE — Progress Notes (Signed)
Orders placed for f/u urinalysis, culture and met b

## 2016-02-12 NOTE — Telephone Encounter (Signed)
Diflucan Rx sent to pharmacy (See result note)

## 2016-02-12 NOTE — Progress Notes (Signed)
Order placed for stat creatinine 

## 2016-02-21 ENCOUNTER — Ambulatory Visit
Admission: RE | Admit: 2016-02-21 | Discharge: 2016-02-21 | Disposition: A | Discharge status: Home / Self Care | Payer: 59 | Source: Ambulatory Visit | Attending: Internal Medicine | Admitting: Internal Medicine

## 2016-02-21 DIAGNOSIS — R109 Unspecified abdominal pain: Secondary | ICD-10-CM | POA: Diagnosis present

## 2016-02-21 DIAGNOSIS — K449 Diaphragmatic hernia without obstruction or gangrene: Secondary | ICD-10-CM | POA: Insufficient documentation

## 2016-02-21 DIAGNOSIS — R140 Abdominal distension (gaseous): Secondary | ICD-10-CM | POA: Insufficient documentation

## 2016-02-21 MED ORDER — IOPAMIDOL (ISOVUE-300) INJECTION 61%
100.0000 mL | Freq: Once | INTRAVENOUS | Status: AC | PRN
  Administered 2016-02-21: 100 mL via INTRAVENOUS

## 2016-02-22 ENCOUNTER — Encounter: Payer: Self-pay | Admitting: Internal Medicine

## 2016-02-23 ENCOUNTER — Other Ambulatory Visit (INDEPENDENT_AMBULATORY_CARE_PROVIDER_SITE_OTHER): Payer: 59

## 2016-02-23 DIAGNOSIS — R319 Hematuria, unspecified: Secondary | ICD-10-CM | POA: Diagnosis not present

## 2016-02-23 DIAGNOSIS — R109 Unspecified abdominal pain: Secondary | ICD-10-CM | POA: Diagnosis not present

## 2016-02-23 NOTE — Addendum Note (Signed)
Addended by: Arby Barrette on: 02/23/2016 11:43 AM   Modules accepted: Orders

## 2016-02-23 NOTE — Addendum Note (Signed)
Addended by: Arby Barrette on: 02/23/2016 11:35 AM   Modules accepted: Orders

## 2016-02-23 NOTE — Addendum Note (Signed)
Addended by: Arby Barrette on: 02/23/2016 11:42 AM   Modules accepted: Orders

## 2016-02-23 NOTE — Addendum Note (Signed)
Addended by: Arby Barrette on: 02/23/2016 11:30 AM   Modules accepted: Orders

## 2016-02-23 NOTE — Addendum Note (Signed)
Addended by: Arby Barrette on: 02/23/2016 11:34 AM   Modules accepted: Orders

## 2016-02-24 LAB — BASIC METABOLIC PANEL
BUN / CREAT RATIO: 14 (ref 9–23)
BUN: 9 mg/dL (ref 6–24)
CALCIUM: 9.1 mg/dL (ref 8.7–10.2)
CO2: 24 mmol/L (ref 18–29)
CREATININE: 0.66 mg/dL (ref 0.57–1.00)
Chloride: 101 mmol/L (ref 96–106)
GFR calc non Af Amer: 102 mL/min/{1.73_m2} (ref >59)
GFR, EST AFRICAN AMERICAN: 117 mL/min/{1.73_m2} (ref >59)
Glucose: 92 mg/dL (ref 65–99)
Potassium: 4.2 mmol/L (ref 3.5–5.2)
Sodium: 139 mmol/L (ref 134–144)

## 2016-02-25 LAB — URINE CULTURE

## 2016-02-26 LAB — URINALYSIS, ROUTINE W REFLEX MICROSCOPIC

## 2016-02-27 ENCOUNTER — Encounter: Payer: Self-pay | Admitting: Internal Medicine

## 2016-03-08 ENCOUNTER — Ambulatory Visit: Payer: 59 | Attending: Internal Medicine | Admitting: Physical Therapy

## 2016-03-08 DIAGNOSIS — M25551 Pain in right hip: Secondary | ICD-10-CM | POA: Insufficient documentation

## 2016-03-08 DIAGNOSIS — M545 Low back pain, unspecified: Secondary | ICD-10-CM

## 2016-03-08 DIAGNOSIS — G8929 Other chronic pain: Secondary | ICD-10-CM | POA: Diagnosis not present

## 2016-03-08 DIAGNOSIS — R262 Difficulty in walking, not elsewhere classified: Secondary | ICD-10-CM | POA: Insufficient documentation

## 2016-03-08 DIAGNOSIS — M25552 Pain in left hip: Secondary | ICD-10-CM | POA: Insufficient documentation

## 2016-03-08 NOTE — Patient Instructions (Addendum)
Pain with sit to stand without hands (in R side of lumbar spine -- more stiffness)   Standing flexion - no increase in pain  Standing extension- reproduced pain in the same area of discomfort   Standing rotations - reports pain in R hip with both directions  Standing lateral trunk flexion - on R side of hip/flank   Gait observation - mild Trendelenburg bilaterally R>L, no other spatio-temporal deviations noted   No clonus on testing  Achille's reflexes WNL, R patellar 1+, L patellar 2+   L5 MMT - WNL at great toe extensor, ankle,knee musculature all 5/5. R hip flexion - painful, limited ROM. IR/ER MMT- 5/5   Passive SLR -on RLE painful ~ 40 degrees  Passive SLR on LLE - painful in back around ~ 90 degrees   Hip IR - 26 on RLE , WNL on LLE (more pain on RLE, pain with both)  L hip scour - no pain R side vertical compression is painful  FABER - L negative   Gaenslen on R - painful, L "just a tiny bit"   Sacral compression - negative   mODI- 14% Worst pain - 8/10   Ely's positive on R>L, hip flexor passive ROM - reproductive of her symptoms    **Treatment   Hip IR mobilizations x 10 Ely's Position stretch  Prone CPAs  To L4/L5, unilateral PSIS Long axis distraction     Active SLR on LLE "knot in her leg" approaching 90 degrees in quad Active SLR on RLE - pain at 34 degrees   R sided 90-90 pain at end range (full ROM) in lumbar spine  L sided 90-90 pain roughly 20 degrees prior to end range

## 2016-03-11 ENCOUNTER — Ambulatory Visit: Payer: 59 | Admitting: Physical Therapy

## 2016-03-11 NOTE — Therapy (Signed)
[facility] [address] [place], [address] Phone: [phone]   Fax:  [phone]  Physical Therapy Evaluation  Patient Details  Name: Sabrina Schneider MRN: [id number] Date of Birth: [birth year] No Data Recorded  Encounter Date: 03/08/2016      PT End of Session - 03/11/16 1237    Visit Number 1   Number of Visits 13   Date for PT Re-Evaluation 05/03/16   PT Start Time 0800   PT Stop Time 0900   PT Time Calculation (min) 60 min   Activity Tolerance Patient limited by pain;Patient tolerated treatment well   Behavior During Therapy [facility] for tasks assessed/performed      Past Medical History:  Diagnosis Date  . GERD (gastroesophageal reflux disease)   . Hiatal hernia     Past Surgical History:  Procedure Laterality Date  . BREAST CYST ASPIRATION  2010   Right breast cyst aspiration  . CESAREAN SECTION  03/18/2001  . COLONOSCOPY N/A 06/01/2014   Procedure: COLONOSCOPY;  Surgeon: Robert Bellow, MD;  Location: [facility] ENDOSCOPY;  Service: Endoscopy;  Laterality: N/A;  . UPPER GI ENDOSCOPY  2015   Dr. Vira Agar    There were no vitals filed for this visit.       Subjective Assessment - 03/11/16 1236    Subjective Patient reports that she has been having bilateral groin pain that wraps around to her lumbar spine. She reports this has been going on for over 3 years. She reports standing for more than 10 minutes or walking for about the same leads to her symptoms, which can lead her to tears. She reports that she has the urge to use the bathroom quite a bit, but can control it. She reports her pain does radiate down the front of her R leg. She does not recall a MOI, it is not a constant issue. Currently she reports if she sits for a while she will feel stiff, pain is worst with standing/walking.    Pertinent History Denies surgical history to LEs or lumbar spine. Denies any unexpected weight loss, fevers,  chills.    Limitations Standing;Walking;House hold activities   Diagnostic tests CT pelvis- No real findings. MRI lumbar spine - normal.    Currently in Pain? Yes  Mild pain/dicomfort in her R thigh, L side of lower back      Pain with sit to stand without hands (in R side of lumbar spine -- more stiffness)   Standing flexion - no increase in pain  Standing extension- reproduced pain in the same area of discomfort   Standing rotations - reports pain in R hip with both directions  Standing lateral trunk flexion - on R side of hip/flank   Gait observation - mild Trendelenburg bilaterally R>L, no other spatio-temporal deviations noted   No clonus on testing  Achille's reflexes WNL, R patellar 1+, L patellar 2+   L5 MMT - WNL at great toe extensor, ankle,knee musculature all 5/5. R hip flexion - painful, limited ROM. IR/ER MMT- 5/5   Passive SLR -on RLE painful ~ 40 degrees  Passive SLR on LLE - painful in back around ~ 90 degrees   Hip IR - 26 on RLE , WNL on LLE (more pain on RLE, pain with both)  L hip scour - no pain R side vertical compression is painful  FABER - L negative   Gaenslen on R - painful, L "just a tiny bit"  Sacral compression - negative   mODI- 14% Worst pain - 8/10   Ely's positive on R>L, hip flexor passive ROM - reproductive of her symptoms    **Treatment   Hip IR mobilizations x 10 Ely's Position stretch  Prone CPAs  To L4/L5, unilateral PSIS Long axis distraction     Active SLR on LLE "knot in her leg" approaching 90 degrees in quad Active SLR on RLE - pain at 34 degrees   R sided 90-90 pain at end range (full ROM) in lumbar spine  L sided 90-90 pain roughly 20 degrees prior to end range                          PT Education - 03/11/16 1236    Education provided Yes   Education Details Will progress with manual techniques based on her response, should reduce in the next 1-3 weeks.    Person(s) Educated Patient    Methods Explanation;Demonstration   Comprehension Verbalized understanding;Returned demonstration             PT Long Term Goals - 03/11/16 1231      PT LONG TERM GOAL #1   Title Patient will stand for more than 30 minutes with no increase in pain to perform community mobility.    Baseline Pain within 10-15 minutes of standing    Time 4   Period Weeks   Status New     PT LONG TERM GOAL #2   Title Patient will report worst pain will be less than 4/10 to demonstrate improved tolerance for ADLs.    Baseline 8/10   Time 4   Period Weeks   Status New     PT LONG TERM GOAL #3   Title Patient will report ODI score of less than 5% disability to demonstrate improved tolerance for ADLs.    Baseline 14%   Time 4   Period Weeks   Status New               Plan - 03/11/16 1226    Clinical Impression Statement Patient is a pleasant 51 y/o female with multiple years of R sided hip and lower back pain that is onset with standing/weightbearing activities. Imaging thus far has been negative. She responds quite well to manual therapy reporting less pain and stiffness immediately after manual techniques performed (hip long axis distraction, hip IR mobilizations, piriformis stretch). Her symptoms correlate to multiple differentials, will continue to treat and re-assess as she is positive for SLR in disc range, SIJ pathology, and hip joint origin of symptoms. Patient would benefit from skilled PT services to address the listed deficits to allow her to stand/ambulate more functional distances and time frames.    Rehab Potential Good   PT Frequency 2x / week   PT Duration 4 weeks   PT Treatment/Interventions Cryotherapy;Moist Heat;Ultrasound;Therapeutic exercise;Therapeutic activities;Patient/family education;Balance training;Stair training;Gait training;Neuromuscular re-education;Taping;Manual techniques;Dry needling   PT Next Visit Plan Progress with manual techniques.   PT Home Exercise  Plan Piriformis stretch    Consulted and Agree with Plan of Care Patient      Patient will benefit from skilled therapeutic intervention in order to improve the following deficits and impairments:  Abnormal gait, Pain, Decreased strength, Decreased activity tolerance, Decreased balance, Difficulty walking  Visit Diagnosis: Chronic bilateral low back pain without sciatica - Plan: PT plan of care cert/re-cert  Pain in right hip - Plan: PT plan of care cert/re-cert  Pain  in left hip - Plan: PT plan of care cert/re-cert  Difficulty in walking, not elsewhere classified - Plan: PT plan of care cert/re-cert     Problem List Patient Active Problem List   Diagnosis Date Noted  . Low back pain 02/06/2016  . Abdominal pain 02/06/2016  . Vitamin D deficiency 11/07/2015  . GERD (gastroesophageal reflux disease) 11/07/2015  . Encounter for screening colonoscopy 04/08/2014   Royce Macadamia PT, DPT, CSCS    03/11/2016, 12:38 PM  [facility] [address] [place], [address] Phone: [phone]   Fax:  [phone]  Name: Sabrina Schneider MRN: [id number] Date of Birth: [birth year]

## 2016-03-12 ENCOUNTER — Ambulatory Visit (INDEPENDENT_AMBULATORY_CARE_PROVIDER_SITE_OTHER): Payer: 59 | Admitting: Gastroenterology

## 2016-03-12 ENCOUNTER — Ambulatory Visit: Payer: 59 | Admitting: Physical Therapy

## 2016-03-12 ENCOUNTER — Encounter: Payer: Self-pay | Admitting: Gastroenterology

## 2016-03-12 VITALS — BP 122/78 | HR 86 | Ht 63.0 in | Wt 157.0 lb

## 2016-03-12 DIAGNOSIS — M25552 Pain in left hip: Secondary | ICD-10-CM

## 2016-03-12 DIAGNOSIS — R140 Abdominal distension (gaseous): Secondary | ICD-10-CM

## 2016-03-12 DIAGNOSIS — M545 Low back pain: Principal | ICD-10-CM

## 2016-03-12 DIAGNOSIS — R262 Difficulty in walking, not elsewhere classified: Secondary | ICD-10-CM

## 2016-03-12 DIAGNOSIS — G8929 Other chronic pain: Secondary | ICD-10-CM

## 2016-03-12 DIAGNOSIS — M25551 Pain in right hip: Secondary | ICD-10-CM

## 2016-03-12 DIAGNOSIS — K219 Gastro-esophageal reflux disease without esophagitis: Secondary | ICD-10-CM | POA: Diagnosis not present

## 2016-03-12 DIAGNOSIS — K6389 Other specified diseases of intestine: Secondary | ICD-10-CM | POA: Diagnosis not present

## 2016-03-12 MED ORDER — DOXYCYCLINE HYCLATE 100 MG PO CAPS: 100.0000 mg | ORAL_CAPSULE | Freq: Two times a day (BID) | ORAL | 0 refills | Status: AC

## 2016-03-12 MED ORDER — RANITIDINE HCL 150 MG PO TABS: 150.0000 mg | ORAL_TABLET | Freq: Every day | ORAL | 0 refills | Status: DC

## 2016-03-12 NOTE — Patient Instructions (Addendum)
Gastroesophageal Reflux Scan A gastroesophageal reflux scan is a procedure that is used to check for gastroesophageal reflux, which is the backward flow of stomach contents into the tube that carries food from the mouth to the stomach (esophagus). The scan can also show if any stomach contents are inhaled (aspirated) into your lungs. You may need this scan if you have symptoms such as heartburn, vomiting, swallowing problems, or regurgitation. Regurgitation means that swallowed food is returning from the stomach to the esophagus. For this scan, you will drink a liquid that contains a small amount of a radioactive substance (tracer). A scanner with a camera that detects the radioactive tracer is used to see if any of the material backs up into your esophagus. Tell a health care provider about:  Any allergies you have.  All medicines you are taking, including vitamins, herbs, eye drops, creams, and over-the-counter medicines.  Any blood disorders you have.  Any surgeries you have had.  Any medical conditions you have.  If you are pregnant or you think that you may be pregnant.  If you are breastfeeding. What are the risks? Generally, this is a safe procedure. However, problems may occur, including:  Exposure to radiation (a small amount).  Allergic reaction to the radioactive substance. This is rare. What happens before the procedure?  Ask your health care provider about changing or stopping your regular medicines. This is especially important if you are taking diabetes medicines or blood thinners.  Follow your health care provider's instructions about eating or drinking restrictions. What happens during the procedure?  You will be asked to drink a liquid that contains a small amount of a radioactive tracer. This liquid will probably be similar to orange juice.  You will assume a position lying on your back.  A series of images will be taken of your esophagus and upper  stomach.  You may be asked to move into different positions to help determine if reflux occurs more often when you are in specific positions.  For adults, an abdominal binder with an inflatable cuff may be placed on the belly (abdomen). This may be used to increase abdominal pressure. More images will be taken to see if the increased pressure causes reflux to occur. The procedure may vary among health care providers and hospitals. What happens after the procedure?  Return to your normal activities and your normal diet as directed by your health care provider.  The radioactive tracer will leave your body over the next few days. Drink enough fluid to keep your urine clear or pale yellow. This will help to flush the tracer out of your body.  It is your responsibility to obtain your test results. Ask your health care provider or the department performing the test when and how you will get your results. This information is not intended to replace advice given to you by your health care provider. Make sure you discuss any questions you have with your health care provider. Document Released: 02/21/2005 Document Revised: 09/25/2015 Document Reviewed: 10/12/2013 Elsevier Interactive Patient Education  2017 [employer]  

## 2016-03-12 NOTE — Patient Instructions (Signed)
Sidelying clamshells on RLE only 3 x 12   Long axis distraction   Piriformis stretch in supine   Hip IR mobilizations

## 2016-03-12 NOTE — Progress Notes (Signed)
Gastroenterology Consultation  Referring Provider:     Einar Pheasant, MD Primary Care Physician:  Einar Pheasant, MD Primary Gastroenterologist:  Dr. Jonathon Bellows  Reason for Consultation:     GERD , gas         HPI:   Sabrina Schneider is a 51 y.o. y/o female referred for consultation & management  by Dr. Einar Pheasant, MD.    She has been referred for acid reflux, gas.  Ct scan of the abdomen in 02/2016 showed a small hiatal hernia but was otherwise normal .Last colonoscopy was in 2016 which was normal .   She says she has had reflux for many years , has had an EGD some years back , was told she had a hernia. Her symptoms are of heartburn , occasionally feels her food gets stuck , over the years she has had these symptoms. Not got any worse over the years or got better. Lately she wakes up every morning , with acid in her throat. Presently she is on Rabeprazole daily , usually in the morning after food. Feels does not help her , says she has a heavy feeling in her chest .   Denies any weight gain. She has her dinner after she finishes her work at 62 , usually at around 8 pm . Goes to bed at midnight . In between she sits up . She does wake up at night with heartburn.    She also complains of bloating for about 6 months, she does have abdominal distension and feels she "looks pregnant" , she passes gas at times it is foul smelling. She has a bowel movement  Daily . Easy to flush . She consumes coffee creamers 2-3 times a week , once a week sodas, no artificial sugars in her diet . When she does not pass the gas , she feels uncomfortable.   Past Medical History:  Diagnosis Date  . GERD (gastroesophageal reflux disease)   . Hiatal hernia     Past Surgical History:  Procedure Laterality Date  . BREAST CYST ASPIRATION  2010   Right breast cyst aspiration  . CESAREAN SECTION  03/18/2001  . COLONOSCOPY N/A 06/01/2014   Procedure: COLONOSCOPY;  Surgeon: Robert Bellow, MD;  Location:  [facility] ENDOSCOPY;  Service: Endoscopy;  Laterality: N/A;  . UPPER GI ENDOSCOPY  2015   Dr. Vira Agar    Prior to Admission medications   Medication Sig Start Date End Date Taking? Authorizing Provider  RABEprazole (ACIPHEX) 20 MG tablet Take 1 tablet (20 mg total) by mouth daily. 02/06/16  Yes Einar Pheasant, MD  triamcinolone cream (KENALOG) 0.1 % Apply 1 application topically 2 (two) times daily. Patient taking differently: Apply 1 application topically 2 (two) times daily as needed.  11/07/15  Yes Einar Pheasant, MD    Family History  Problem Relation Age of Onset  . Hypertension Mother   . Diabetes Mother   . Breast cancer Daughter     88     Social History  Substance Use Topics  . Smoking status: Never Smoker  . Smokeless tobacco: Never Used  . Alcohol use 0.0 oz/week     Comment: occasionally    Allergies as of 03/12/2016  . (No Known Allergies)    Review of Systems:    All systems reviewed and negative except where noted in HPI.   Physical Exam:  BP 122/78   Pulse 86   Ht 5\' 3"  (1.6 m)   Wt 157  lb (71.2 kg)   LMP 02/21/2016   BMI 27.81 kg/m  Patient's last menstrual period was 02/21/2016. Psych:  Alert and cooperative. Normal mood and affect. General:   Alert,  Well-developed, well-nourished, pleasant and cooperative in NAD Head:  Normocephalic and atraumatic. Eyes:  Sclera clear, no icterus.   Conjunctiva pink. Ears:  Normal auditory acuity. Nose:  No deformity, discharge, or lesions. Mouth:  No deformity or lesions,oropharynx pink & moist. Neck:  Supple; no masses or thyromegaly. Lungs:  Respirations even and unlabored.  Clear throughout to auscultation.   No wheezes, crackles, or rhonchi. No acute distress. Heart:  Regular rate and rhythm; no murmurs, clicks, rubs, or gallops. Abdomen:  Normal bowel sounds.  No bruits.  Soft, non-tender and non-distended without masses, hepatosplenomegaly or hernias noted.  No guarding or rebound tenderness.    Msk:   Symmetrical without gross deformities. Good, equal movement & strength bilaterally. Pulses:  Normal pulses noted. Extremities:  No clubbing or edema.  No cyanosis. Neurologic:  Alert and oriented x3;  grossly normal neurologically. Psych:  Alert and cooperative. Normal mood and affect.  Imaging Studies: Ct Abdomen Pelvis W Contrast  Result Date: 02/21/2016 CLINICAL DATA:  Intermittent lower abdominal pain for 1-2 years. Bloating. EXAM: CT ABDOMEN AND PELVIS WITH CONTRAST TECHNIQUE: Multidetector CT imaging of the abdomen and pelvis was performed using the standard protocol following bolus administration of intravenous contrast. CONTRAST:  124mL ISOVUE-300 IOPAMIDOL (ISOVUE-300) INJECTION 61% COMPARISON:  None. FINDINGS: Lower chest: Small hiatal hernia.  Otherwise normal. Hepatobiliary: Liver parenchyma is normal. No dilated bile ducts. Inhomogeneity of the gallbladder suggests stones but this is not definitive. Pancreas: Unremarkable. No pancreatic ductal dilatation or surrounding inflammatory changes. Spleen: Normal in size without focal abnormality. Adrenals/Urinary Tract: Adrenal glands are unremarkable. Kidneys are normal, without renal calculi, focal lesion, or hydronephrosis. Bladder is unremarkable. Stomach/Bowel: Stomach is within normal limits except for small hiatal hernia. Appendix appears normal. No evidence of bowel wall thickening, distention, or inflammatory changes.Small hiatal hernia. Vascular/Lymphatic: No significant vascular findings are present. No enlarged abdominal or pelvic lymph nodes. Reproductive: Uterus and bilateral adnexa are unremarkable. Other: No abdominal wall hernia or abnormality. No abdominopelvic ascites. Musculoskeletal: No acute or significant osseous findings. IMPRESSION: Benign-appearing abdomen and pelvis. Electronically Signed   By: Lorriane Shire M.D.   On: 02/21/2016 09:12    Assessment and Plan:   Sabrina Schneider 51 y.o. female has been referred for acid  reflux and gas/bloating   1. GERD:Counseled on life style changes, suggest to use PPI first thing in the morning on empty stomach and eat 30 minutes after. Advised on the use of a wedge pillow at night , avoid meals for 2 hours prior to bed time. Weight loss .add Zantac at night . She has been taking her PPI after meals, advised to take it before breakfast .   Discussed the risks and benefits of long term PPI use including but not limited to bone loss, chronic kidney disease, infections , low magnesium . Aim to use at the lowest dose for the shortest period of time    2. 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 Will empirically treat with doxycycline for 4 weeks. I will also check TSH and celiac serology. Trial of low FODMAP diet .   F/u in 4 weeks   Dr Jonathon Bellows MD

## 2016-03-12 NOTE — Therapy (Signed)
[place] [facility] [address], [place], [address] Phone: [phone]   Fax:  [phone]  Physical Therapy Treatment  Patient Details  Name: Sabrina Schneider MRN: [id number] Date of Birth: [birth year] No Data Recorded  Encounter Date: 03/12/2016      PT End of Session - 03/12/16 1550    Visit Number 2   Number of Visits 13   Date for PT Re-Evaluation 05/03/16   PT Start Time 1537   PT Stop Time 1602   PT Time Calculation (min) 25 min   Activity Tolerance Patient tolerated treatment well   Behavior During Therapy [facility] for tasks assessed/performed      Past Medical History:  Diagnosis Date  . GERD (gastroesophageal reflux disease)   . Hiatal hernia     Past Surgical History:  Procedure Laterality Date  . BREAST CYST ASPIRATION  2010   Right breast cyst aspiration  . CESAREAN SECTION  03/18/2001  . COLONOSCOPY N/A 06/01/2014   Procedure: COLONOSCOPY;  Surgeon: Robert Bellow, MD;  Location: [facility] ENDOSCOPY;  Service: Endoscopy;  Laterality: N/A;  . UPPER GI ENDOSCOPY  2015   Dr. Vira Agar    There were no vitals filed for this visit.      Subjective Assessment - 03/12/16 1538    Subjective Patient reports she has had several good days, however on Sunday she reports she was washing her face/hair at the sink and began having more of the intense pain she sought treatment for. She reports she was not sore on Saturday or Friday after work.    Pertinent History Denies surgical history to LEs or lumbar spine. Denies any unexpected weight loss, fevers, chills.    Limitations Standing;Walking;House hold activities   Diagnostic tests CT pelvis- No real findings. MRI lumbar spine - normal.    Currently in Pain? Yes  Reports mild pain in posterior R hip area.       Sidelying clamshells on RLE only 3 x 12 -- no pain reported during activity   Long axis distraction x 2 minutes with relief of symptoms while  ambulating afterwards   Piriformis stretch in supine x 10 bouts x 30" of oscillations at end range (mild discomfort, but felt relief afterwards)  Hip IR mobilizations with belt -- reported relief of symptoms, notable for ~ 50% decrease in ROM during completion, not painful during/afterwards.                            PT Education - 03/12/16 1550    Education provided Yes   Education Details Educated on how to perform manual techniques at home, HEP   Person(s) Educated Patient   Methods Explanation;Demonstration;Handout   Comprehension Verbalized understanding;Returned demonstration             PT Long Term Goals - 03/11/16 1231      PT LONG TERM GOAL #1   Title Patient will stand for more than 30 minutes with no increase in pain to perform community mobility.    Baseline Pain within 10-15 minutes of standing    Time 4   Period Weeks   Status New     PT LONG TERM GOAL #2   Title Patient will report worst pain will be less than 4/10 to demonstrate improved tolerance for ADLs.    Baseline 8/10   Time 4   Period Weeks   Status New  PT LONG TERM GOAL #3   Title Patient will report ODI score of less than 5% disability to demonstrate improved tolerance for ADLs.    Baseline 14%   Time 4   Period Weeks   Status New               Plan - 03/12/16 1554    Clinical Impression Statement Patient has positive response to manual therapy and hip abductor ER strengthening exercises provided on this date. She reports less pain on most days since previous PT visit, and again reports no pain at the conclusion of this session. This appears to be some form of gluteal referred pain or posterior hip musculature dysfunction. She is progressing well towards mobility goals.    Rehab Potential Good   PT Frequency 2x / week   PT Duration 4 weeks   PT Treatment/Interventions Cryotherapy;Moist Heat;Ultrasound;Therapeutic exercise;Therapeutic  activities;Patient/family education;Balance training;Stair training;Gait training;Neuromuscular re-education;Taping;Manual techniques;Dry needling   PT Next Visit Plan Progress with manual techniques.   PT Home Exercise Plan Piriformis stretch    Consulted and Agree with Plan of Care Patient      Patient will benefit from skilled therapeutic intervention in order to improve the following deficits and impairments:  Abnormal gait, Pain, Decreased strength, Decreased activity tolerance, Decreased balance, Difficulty walking  Visit Diagnosis: Chronic bilateral low back pain without sciatica  Pain in right hip  Pain in left hip  Difficulty in walking, not elsewhere classified     Problem List Patient Active Problem List   Diagnosis Date Noted  . Low back pain 02/06/2016  . Abdominal pain 02/06/2016  . Vitamin D deficiency 11/07/2015  . GERD (gastroesophageal reflux disease) 11/07/2015  . Encounter for screening colonoscopy 04/08/2014   Royce Macadamia PT, DPT, CSCS    03/12/2016, 4:11 PM  [place] [facility] [address] [place], [address] Phone: [phone]   Fax:  [phone]  Name: Sabrina Schneider MRN: [id number] Date of Birth: 09-03-1965

## 2016-03-14 LAB — CELIAC DISEASE PANEL
Endomysial IgA: NEGATIVE
IGA/IMMUNOGLOBULIN A, SERUM: 257 mg/dL (ref 87–352)
Transglutaminase IgA: <2 U/mL (ref 0–3)

## 2016-03-14 LAB — TSH: TSH: 1.72 u[IU]/mL (ref 0.450–4.500)

## 2016-03-15 ENCOUNTER — Ambulatory Visit: Payer: 59 | Attending: Internal Medicine | Admitting: Physical Therapy

## 2016-03-15 DIAGNOSIS — M545 Low back pain, unspecified: Secondary | ICD-10-CM

## 2016-03-15 DIAGNOSIS — M25551 Pain in right hip: Secondary | ICD-10-CM | POA: Diagnosis not present

## 2016-03-15 DIAGNOSIS — R262 Difficulty in walking, not elsewhere classified: Secondary | ICD-10-CM | POA: Insufficient documentation

## 2016-03-15 DIAGNOSIS — G8929 Other chronic pain: Secondary | ICD-10-CM | POA: Insufficient documentation

## 2016-03-15 DIAGNOSIS — M25552 Pain in left hip: Secondary | ICD-10-CM | POA: Diagnosis present

## 2016-03-15 NOTE — Therapy (Signed)
[place] [facility] [address], [place], [address] Phone: [phone]   Fax:  [phone]  Physical Therapy Treatment  Patient Details  Name: Sabrina Schneider MRN: [id number] Date of Birth: September 05, 1965 No Data Recorded  Encounter Date: 03/15/2016      PT End of Session - 03/15/16 1019    Visit Number 3   Number of Visits 13   Date for PT Re-Evaluation 05/03/16   PT Start Time 1001   PT Stop Time 1039   PT Time Calculation (min) 38 min   Activity Tolerance Patient tolerated treatment well   Behavior During Therapy [facility] for tasks assessed/performed      Past Medical History:  Diagnosis Date  . GERD (gastroesophageal reflux disease)   . Hiatal hernia     Past Surgical History:  Procedure Laterality Date  . BREAST CYST ASPIRATION  2010   Right breast cyst aspiration  . CESAREAN SECTION  03/18/2001  . COLONOSCOPY N/A 06/01/2014   Procedure: COLONOSCOPY;  Surgeon: Robert Bellow, MD;  Location: [facility] ENDOSCOPY;  Service: Endoscopy;  Laterality: N/A;  . UPPER GI ENDOSCOPY  2015   Dr. Vira Agar    There were no vitals filed for this visit.      Subjective Assessment - 03/15/16 1004    Subjective Patient reports she continues to notice improvement, not resolution of symptoms but much improved.    Pertinent History Denies surgical history to LEs or lumbar spine. Denies any unexpected weight loss, fevers, chills.    Limitations Standing;Walking;House hold activities   Diagnostic tests CT pelvis- No real findings. MRI lumbar spine - normal.    Currently in Pain? Yes  Some soreness in her R hip, but much improved.       Lumbar rotational mobilizations grade II x 5 bouts for 30" bilaterally -- appears to reduce her symptoms.   Double knee to chest x 12 with 3-5" holds -- reports beneficial, but not as beneficial as hip flexor stretch  Supine bridging x 10 with 3" holds (challenging but well tolerated)  Half  kneeling hip flexor stretch x 10 per side -- patient reports this was right in the spot she was having symptoms -- Performed 2 sets   Hip IR mobilizations x 10 repetitions on RLE x 2 sets with 10-15" oscillations (reports decrease in symptoms with gait afterwards)                            PT Education - 03/15/16 1017    Education provided Yes   Education Details Strettching appears to be helpful for acute pain relief, resistance exercise may be beneficial for long term relief.    Person(s) Educated Patient   Methods Explanation   Comprehension Verbalized understanding             PT Long Term Goals - 03/11/16 1231      PT LONG TERM GOAL #1   Title Patient will stand for more than 30 minutes with no increase in pain to perform community mobility.    Baseline Pain within 10-15 minutes of standing    Time 4   Period Weeks   Status New     PT LONG TERM GOAL #2   Title Patient will report worst pain will be less than 4/10 to demonstrate improved tolerance for ADLs.    Baseline 8/10   Time 4   Period Weeks  Status New     PT LONG TERM GOAL #3   Title Patient will report ODI score of less than 5% disability to demonstrate improved tolerance for ADLs.    Baseline 14%   Time 4   Period Weeks   Status New               Plan - 03/15/16 1019    Clinical Impression Statement Patient reporting she is feeling much better since starting therapy. She reports benefit from hip flexor stretch, hip IR mobilizations on this date. She tolerated progression of strengthening exercises well, though reports even with 1 set of bridging she will likely be sore, indicative of the need for continued LE strengthening.    Rehab Potential Good   PT Frequency 2x / week   PT Duration 4 weeks   PT Treatment/Interventions Cryotherapy;Moist Heat;Ultrasound;Therapeutic exercise;Therapeutic activities;Patient/family education;Balance training;Stair training;Gait  training;Neuromuscular re-education;Taping;Manual techniques;Dry needling   PT Next Visit Plan Progress with manual techniques.   PT Home Exercise Plan Piriformis stretch    Consulted and Agree with Plan of Care Patient      Patient will benefit from skilled therapeutic intervention in order to improve the following deficits and impairments:  Abnormal gait, Pain, Decreased strength, Decreased activity tolerance, Decreased balance, Difficulty walking  Visit Diagnosis: Chronic bilateral low back pain without sciatica  Pain in right hip  Pain in left hip  Difficulty in walking, not elsewhere classified     Problem List Patient Active Problem List   Diagnosis Date Noted  . Low back pain 02/06/2016  . Abdominal pain 02/06/2016  . Vitamin D deficiency 11/07/2015  . GERD (gastroesophageal reflux disease) 11/07/2015  . Encounter for screening colonoscopy 04/08/2014    Royce Macadamia 03/15/2016, 10:48 AM  [place] [facility] [address] [place], [address] Phone: [phone]   Fax:  [phone]  Name: Sabrina Schneider MRN: [id number] Date of Birth: 1965-08-30

## 2016-03-15 NOTE — Patient Instructions (Addendum)
Lumbar rotational mobilizations grade II x 5 bouts for 30" bilaterally -- appears to reduce her symptoms.   Double knee to chest x 12 with 3-5" holds   Supine bridging   Half kneeling hip flexor stretch x 10 per side -- patient reports this was right in the spot she was having symptoms -- Performed 2 sets   Hip IR mobilizations x 10 repetitions

## 2016-03-19 ENCOUNTER — Ambulatory Visit: Payer: 59 | Admitting: Physical Therapy

## 2016-03-19 DIAGNOSIS — G8929 Other chronic pain: Secondary | ICD-10-CM

## 2016-03-19 DIAGNOSIS — R262 Difficulty in walking, not elsewhere classified: Secondary | ICD-10-CM

## 2016-03-19 DIAGNOSIS — M25551 Pain in right hip: Secondary | ICD-10-CM

## 2016-03-19 DIAGNOSIS — M25552 Pain in left hip: Secondary | ICD-10-CM

## 2016-03-19 DIAGNOSIS — M545 Low back pain: Principal | ICD-10-CM

## 2016-03-19 NOTE — Patient Instructions (Addendum)
Instructed and observed patient perform HEP including   Supine bridging with yellow t- band around knees x 10  Sidelying clamshells x 10 on R side only with yellow t-band  Standing hip abductions x 8 with yellow t-band   Hip IR mobilizations with belt x 5 minutes with bouts of 15-30" on, 15" off grade III -- well tolerated  Long axis distraction x 3 minutes -- after completion reports significant decrease in symptoms, reports now just very mild spot on lateral hip.

## 2016-03-19 NOTE — Therapy (Signed)
[place] [facility] [address], [place], [address] Phone: [phone]   Fax:  [phone]  Physical Therapy Treatment  Patient Details  Name: Sabrina Schneider MRN: [id number] Date of Birth: 21-Jun-1965 No Data Recorded  Encounter Date: 03/19/2016      PT End of Session - 03/19/16 1455    Visit Number 4   Number of Visits 13   Date for PT Re-Evaluation 05/03/16   PT Start Time 1446   PT Stop Time 1510   PT Time Calculation (min) 24 min   Activity Tolerance Patient tolerated treatment well   Behavior During Therapy [facility] for tasks assessed/performed      Past Medical History:  Diagnosis Date  . GERD (gastroesophageal reflux disease)   . Hiatal hernia     Past Surgical History:  Procedure Laterality Date  . BREAST CYST ASPIRATION  2010   Right breast cyst aspiration  . CESAREAN SECTION  03/18/2001  . COLONOSCOPY N/A 06/01/2014   Procedure: COLONOSCOPY;  Surgeon: Robert Bellow, MD;  Location: [facility] ENDOSCOPY;  Service: Endoscopy;  Laterality: N/A;  . UPPER GI ENDOSCOPY  2015   Dr. Vira Agar    There were no vitals filed for this visit.      Subjective Assessment - 03/19/16 1449    Subjective Patient reports she has been able to do her hair, stand up for prolonged period of time without the sharp pain. She does continue to have mild discomfort in the groin area bilaterally, though this has reduced.    Pertinent History Denies surgical history to LEs or lumbar spine. Denies any unexpected weight loss, fevers, chills.    Limitations Standing;Walking;House hold activities   Diagnostic tests CT pelvis- No real findings. MRI lumbar spine - normal.    Currently in Pain? Yes  Mild pain with standing in bilateral groin area.       Instructed and observed patient perform HEP including   Supine bridging with yellow t- band around knees x 10  Sidelying clamshells x 10 on R side only with yellow t-band  Standing hip  abductions x 8 with yellow t-band   Hip IR mobilizations with belt x 5 minutes with bouts of 15-30" on, 15" off grade III -- well tolerated  Long axis distraction x 3 minutes -- after completion reports significant decrease in symptoms, reports now just very mild spot on lateral hip.                            PT Education - 03/19/16 1455    Education provided Yes   Education Details Will follow up in 2-3 weeks given her progress, progressed HEP to address residual strength deficits.    Person(s) Educated Patient   Methods Explanation;Demonstration;Handout   Comprehension Verbalized understanding;Returned demonstration             PT Long Term Goals - 03/11/16 1231      PT LONG TERM GOAL #1   Title Patient will stand for more than 30 minutes with no increase in pain to perform community mobility.    Baseline Pain within 10-15 minutes of standing    Time 4   Period Weeks   Status New     PT LONG TERM GOAL #2   Title Patient will report worst pain will be less than 4/10 to demonstrate improved tolerance for ADLs.    Baseline 8/10   Time 4  Period Weeks   Status New     PT LONG TERM GOAL #3   Title Patient will report ODI score of less than 5% disability to demonstrate improved tolerance for ADLs.    Baseline 14%   Time 4   Period Weeks   Status New               Plan - 03/19/16 1456    Clinical Impression Statement Patient reports significant improvement, she does have some residual groin pain, though this is relieved (for the most part) with hip IR mobilizations. Provided patient with HEP to address posterior strength deficits, will follow up for likely discharge in 2-3 weeks.    Rehab Potential Good   PT Frequency 2x / week   PT Duration 4 weeks   PT Treatment/Interventions Cryotherapy;Moist Heat;Ultrasound;Therapeutic exercise;Therapeutic activities;Patient/family education;Balance training;Stair training;Gait training;Neuromuscular  re-education;Taping;Manual techniques;Dry needling   PT Next Visit Plan Progress with manual techniques.   PT Home Exercise Plan Piriformis stretch    Consulted and Agree with Plan of Care Patient      Patient will benefit from skilled therapeutic intervention in order to improve the following deficits and impairments:  Abnormal gait, Pain, Decreased strength, Decreased activity tolerance, Decreased balance, Difficulty walking  Visit Diagnosis: Chronic bilateral low back pain without sciatica  Pain in right hip  Pain in left hip  Difficulty in walking, not elsewhere classified     Problem List Patient Active Problem List   Diagnosis Date Noted  . Low back pain 02/06/2016  . Abdominal pain 02/06/2016  . Vitamin D deficiency 11/07/2015  . GERD (gastroesophageal reflux disease) 11/07/2015  . Encounter for screening colonoscopy 04/08/2014   Royce Macadamia PT, DPT, CSCS    03/19/2016, 3:15 PM  [place] [facility] [address], [place], [address] Phone: [phone]   Fax:  [phone]  Name: Sabrina Schneider MRN: [id number] Date of Birth: [birth year]

## 2016-03-26 ENCOUNTER — Encounter: Payer: 59 | Admitting: Physical Therapy

## 2016-03-26 ENCOUNTER — Ambulatory Visit (INDEPENDENT_AMBULATORY_CARE_PROVIDER_SITE_OTHER): Payer: 59 | Admitting: Internal Medicine

## 2016-03-26 ENCOUNTER — Encounter: Payer: Self-pay | Admitting: Internal Medicine

## 2016-03-26 VITALS — BP 124/76 | HR 73 | Temp 98.6°F | Resp 16 | Ht 63.0 in | Wt 159.6 lb

## 2016-03-26 DIAGNOSIS — R109 Unspecified abdominal pain: Secondary | ICD-10-CM | POA: Diagnosis not present

## 2016-03-26 DIAGNOSIS — Z1211 Encounter for screening for malignant neoplasm of colon: Secondary | ICD-10-CM | POA: Diagnosis not present

## 2016-03-26 DIAGNOSIS — Z1231 Encounter for screening mammogram for malignant neoplasm of breast: Secondary | ICD-10-CM | POA: Diagnosis not present

## 2016-03-26 DIAGNOSIS — K219 Gastro-esophageal reflux disease without esophagitis: Secondary | ICD-10-CM

## 2016-03-26 DIAGNOSIS — Z1239 Encounter for other screening for malignant neoplasm of breast: Secondary | ICD-10-CM

## 2016-03-26 MED ORDER — RABEPRAZOLE SODIUM 20 MG PO TBEC: 20.0000 mg | DELAYED_RELEASE_TABLET | Freq: Every day | ORAL | 5 refills | Status: DC

## 2016-03-26 NOTE — Progress Notes (Signed)
Pre-visit discussion using our clinic review tool. No additional management support is needed unless otherwise documented below in the visit note.  

## 2016-03-26 NOTE — Progress Notes (Signed)
Patient ID: Sabrina Schneider, female   DOB: 12/21/1965, 51 y.o.   MRN: [id number]   Subjective:    Patient ID: Sabrina Schneider, female    DOB: 1965-10-03, 51 y.o.   MRN: [id number]  HPI  Patient here for a scheduled follow up.  She was having increased gas and bloating.  CT negative.  Saw Dr Vicente Males. Instructed to use PPI and zantac.  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  Was placed on doxycycline.  She is to take this for four weeks and then f/u with Dr Vicente Males.  Due f/u at the end of the month.  She reports she is eating.  No nausea or vomiting.  No chest pain.  Breathing stable.     Past Medical History:  Diagnosis Date  . GERD (gastroesophageal reflux disease)   . Hiatal hernia    Past Surgical History:  Procedure Laterality Date  . BREAST CYST ASPIRATION  2010   Right breast cyst aspiration  . CESAREAN SECTION  03/18/2001  . COLONOSCOPY N/A 06/01/2014   Procedure: COLONOSCOPY;  Surgeon: Robert Bellow, MD;  Location: [facility] ENDOSCOPY;  Service: Endoscopy;  Laterality: N/A;  . UPPER GI ENDOSCOPY  2015   Dr. Vira Agar   Family History  Problem Relation Age of Onset  . Hypertension Mother   . Diabetes Mother   . Breast cancer Daughter     74   Social History   Social History  . Marital status: Single    Spouse name: N/A  . Number of children: N/A  . Years of education: N/A   Social History Main Topics  . Smoking status: Never Smoker  . Smokeless tobacco: Never Used  . Alcohol use 0.0 oz/week     Comment: occasionally  . Drug use: No  . Sexual activity: Not Asked   Other Topics Concern  . None   Social History Narrative  . None    Outpatient Encounter Prescriptions as of 03/26/2016  Medication Sig  . doxycycline (VIBRAMYCIN) 100 MG capsule Take 1 capsule (100 mg total) by mouth 2 (two) times daily.  . RABEprazole (ACIPHEX) 20 MG tablet Take 1 tablet (20 mg total) by mouth daily.  . ranitidine (ZANTAC) 150 MG tablet Take 1 tablet (150 mg total)  by mouth at bedtime.  . triamcinolone cream (KENALOG) 0.1 % Apply 1 application topically 2 (two) times daily. (Patient taking differently: Apply 1 application topically 2 (two) times daily as needed. )  . [DISCONTINUED] RABEprazole (ACIPHEX) 20 MG tablet Take 1 tablet (20 mg total) by mouth daily.   No facility-administered encounter medications on file as of 03/26/2016.     Review of Systems  Constitutional: Negative for appetite change and unexpected weight change.  HENT: Negative for congestion and sinus pressure.   Respiratory: Negative for cough, chest tightness and shortness of breath.   Cardiovascular: Negative for chest pain, palpitations and leg swelling.  Gastrointestinal: Negative for diarrhea, nausea and vomiting.       Abdominal bloating.    Genitourinary: Negative for difficulty urinating and dysuria.  Musculoskeletal: Negative for back pain and joint swelling.  Skin: Negative for color change and rash.  Neurological: Negative for dizziness, light-headedness and headaches.  Psychiatric/Behavioral: Negative for agitation and dysphoric mood.       Objective:    Physical Exam  Constitutional: She appears well-developed and well-nourished. No distress.  HENT:  Nose: Nose normal.  Mouth/Throat: Oropharynx is clear and moist.  Neck: Neck supple. No  thyromegaly present.  Cardiovascular: Normal rate and regular rhythm.   Pulmonary/Chest: Breath sounds normal. No respiratory distress. She has no wheezes.  Abdominal: Soft. Bowel sounds are normal. There is no tenderness.  Musculoskeletal: She exhibits no edema or tenderness.  Lymphadenopathy:    She has no cervical adenopathy.  Skin: No rash noted. No erythema.  Psychiatric: She has a normal mood and affect. Her behavior is normal.    BP 124/76 (BP Location: Left Arm, Patient Position: Sitting, Cuff Size: Normal)   Pulse 73   Temp 98.6 F (37 C) (Oral)   Resp 16   Ht 5\' 3"  (1.6 m)   Wt 159 lb 9.6 oz (72.4 kg)   LMP  03/18/2016   SpO2 96%   BMI 28.27 kg/m  Wt Readings from Last 3 Encounters:  03/26/16 159 lb 9.6 oz (72.4 kg)  03/12/16 157 lb (71.2 kg)  02/06/16 153 lb 6 oz (69.6 kg)     Lab Results  Component Value Date   WBC 4.6 02/06/2016   HCT 35.6 02/06/2016   PLT 293 02/06/2016   GLUCOSE 92 02/23/2016   CHOL 145 11/07/2015   TRIG 88 11/07/2015   HDL 55 11/07/2015   LDLCALC 72 11/07/2015   ALT 13 02/06/2016   AST 15 02/06/2016   NA 139 02/23/2016   K 4.2 02/23/2016   CL 101 02/23/2016   CREATININE 0.66 02/23/2016   BUN 9 02/23/2016   CO2 24 02/23/2016   TSH 1.720 03/12/2016   HGBA1C 5.2 11/27/2015    Ct Abdomen Pelvis W Contrast  Result Date: 02/21/2016 CLINICAL DATA:  Intermittent lower abdominal pain for 1-2 years. Bloating. EXAM: CT ABDOMEN AND PELVIS WITH CONTRAST TECHNIQUE: Multidetector CT imaging of the abdomen and pelvis was performed using the standard protocol following bolus administration of intravenous contrast. CONTRAST:  128mL ISOVUE-300 IOPAMIDOL (ISOVUE-300) INJECTION 61% COMPARISON:  None. FINDINGS: Lower chest: Small hiatal hernia.  Otherwise normal. Hepatobiliary: Liver parenchyma is normal. No dilated bile ducts. Inhomogeneity of the gallbladder suggests stones but this is not definitive. Pancreas: Unremarkable. No pancreatic ductal dilatation or surrounding inflammatory changes. Spleen: Normal in size without focal abnormality. Adrenals/Urinary Tract: Adrenal glands are unremarkable. Kidneys are normal, without renal calculi, focal lesion, or hydronephrosis. Bladder is unremarkable. Stomach/Bowel: Stomach is within normal limits except for small hiatal hernia. Appendix appears normal. No evidence of bowel wall thickening, distention, or inflammatory changes.Small hiatal hernia. Vascular/Lymphatic: No significant vascular findings are present. No enlarged abdominal or pelvic lymph nodes. Reproductive: Uterus and bilateral adnexa are unremarkable. Other: No abdominal wall  hernia or abnormality. No abdominopelvic ascites. Musculoskeletal: No acute or significant osseous findings. IMPRESSION: Benign-appearing abdomen and pelvis. Electronically Signed   By: Lorriane Shire M.D.   On: 02/21/2016 09:12       Assessment & Plan:   Problem List Items Addressed This Visit    Abdominal pain    Increased gas and bloating as outlined.  Being treated for bacterial overgrowth with doxycycline.  Has f/u scheduled at the end of the month.        Encounter for screening colonoscopy    Had colonoscopy 05/2014.  Recommended f/u colonoscopy in 10 years.  Seeing GI.        GERD (gastroesophageal reflux disease)    On aciphex and zantac.  Follow.  Has f/u with GI at the end of the month.        Relevant Medications   RABEprazole (ACIPHEX) 20 MG tablet    Other Visit  Diagnoses    Screening for breast cancer    -  Primary   Relevant Orders   MM DIGITAL SCREENING BILATERAL       Einar Pheasant, MD

## 2016-04-03 ENCOUNTER — Encounter: Payer: 59 | Admitting: Physical Therapy

## 2016-04-04 DIAGNOSIS — Z1231 Encounter for screening mammogram for malignant neoplasm of breast: Secondary | ICD-10-CM | POA: Diagnosis not present

## 2016-04-04 LAB — HM MAMMOGRAPHY

## 2016-04-07 ENCOUNTER — Encounter: Payer: Self-pay | Admitting: Internal Medicine

## 2016-04-07 NOTE — Assessment & Plan Note (Signed)
Increased gas and bloating as outlined.  Being treated for bacterial overgrowth with doxycycline.  Has f/u scheduled at the end of the month.

## 2016-04-07 NOTE — Assessment & Plan Note (Signed)
On aciphex and zantac.  Follow.  Has f/u with GI at the end of the month.

## 2016-04-07 NOTE — Assessment & Plan Note (Signed)
Had colonoscopy 05/2014.  Recommended f/u colonoscopy in 10 years.  Seeing GI.

## 2016-04-10 ENCOUNTER — Ambulatory Visit: Payer: 59 | Admitting: Physical Therapy

## 2016-04-10 ENCOUNTER — Ambulatory Visit (INDEPENDENT_AMBULATORY_CARE_PROVIDER_SITE_OTHER): Payer: 59 | Admitting: Gastroenterology

## 2016-04-10 ENCOUNTER — Encounter: Payer: Self-pay | Admitting: Gastroenterology

## 2016-04-10 ENCOUNTER — Encounter: Payer: 59 | Admitting: Physical Therapy

## 2016-04-10 VITALS — BP 131/84 | HR 77 | Temp 98.3°F | Ht 63.0 in | Wt 157.6 lb

## 2016-04-10 DIAGNOSIS — K219 Gastro-esophageal reflux disease without esophagitis: Secondary | ICD-10-CM | POA: Diagnosis not present

## 2016-04-10 DIAGNOSIS — K9289 Other specified diseases of the digestive system: Secondary | ICD-10-CM

## 2016-04-10 NOTE — Patient Instructions (Signed)
If you have any questions prior to your follow up appointment, please contact our office.

## 2016-04-10 NOTE — Progress Notes (Signed)
Primary Care Physician: Einar Pheasant, MD  Primary Gastroenterologist:  Dr. Jonathon Bellows   Chief Complaint  Patient presents with  . Follow-up    HPI: Sabrina Schneider is a 51 y.o. female is here today for a follow up .Last seen 03/12/16.  Summary of history : She is being followed  for acid reflux, gas.  Ct scan of the abdomen in 02/2016 showed a small hiatal hernia but was otherwise normal .Last colonoscopy was in 2016 which was normal .   She says she has had reflux for many years , has had an EGD some years back , was told she had a hernia.   Interval history 02/2016-03/2016 Celiac serology was negative, TSH-normal  She is on the wedge pillow which she thinks her reflux, feels she is doing better, Taking her PPI in  the morning . Also on zantac at night.   She did take the antibiotics for the bloating -says it may have helped herbloating a bit. She did not really try the low fodmap diet.   Current Outpatient Prescriptions  Medication Sig Dispense Refill  . RABEprazole (ACIPHEX) 20 MG tablet Take 1 tablet (20 mg total) by mouth daily. 30 tablet 5  . ranitidine (ZANTAC) 150 MG tablet Take 1 tablet (150 mg total) by mouth at bedtime. 59 tablet 0  . triamcinolone cream (KENALOG) 0.1 % Apply 1 application topically 2 (two) times daily. (Patient taking differently: Apply 1 application topically 2 (two) times daily as needed. ) 30 g 0   No current facility-administered medications for this visit.     Allergies as of 04/10/2016  . (No Known Allergies)    ROS:  General: Negative for anorexia, weight loss, fever, chills, fatigue, weakness. ENT: Negative for hoarseness, difficulty swallowing , nasal congestion. CV: Negative for chest pain, angina, palpitations, dyspnea on exertion, peripheral edema.  Respiratory: Negative for dyspnea at rest, dyspnea on exertion, cough, sputum, wheezing.  GI: See history of present illness. GU:  Negative for dysuria, hematuria, urinary  incontinence, urinary frequency, nocturnal urination.  Endo: Negative for unusual weight change.    Physical Examination:   BP 131/84 (BP Location: Left Arm, Patient Position: Sitting, Cuff Size: Normal)   Pulse 77   Temp 98.3 F (36.8 C) (Oral)   Ht 5\' 3"  (1.6 m)   Wt 157 lb 9.6 oz (71.5 kg)   LMP 03/18/2016   BMI 27.92 kg/m   General: Well-nourished, well-developed in no acute distress.  Eyes: No icterus. Conjunctivae pink. Mouth: Oropharyngeal mucosa moist and pink , no lesions erythema or exudate. Lungs: Clear to auscultation bilaterally. Non-labored. Heart: Regular rate and rhythm, no murmurs rubs or gallops.  Abdomen: Bowel sounds are normal, nontender, nondistended, no hepatosplenomegaly or masses, no abdominal bruits or hernia , no rebound or guarding.   Extremities: No lower extremity edema. No clubbing or deformities. Neuro: Alert and oriented x 3.  Grossly intact. Skin: Warm and dry, no jaundice.   Psych: Alert and cooperative, normal mood and affect.   Imaging Studies: No results found.  Assessment and Plan:   Sabrina Schneider is a 51 y.o. y/o female being followed for acid reflux and gas/bloating   1. GERD: Zantac at night . She has been taking her PPI after meals, advised to take it before breakfast . Overall feels much better but symptoms not completely resolved, advised we can perform an EGd at this point but she would like to wait  A bit.   2. Gas/bloating: Better  after course of doxycycline but not completely resolved. Advised to d/c all artificial sugars in her diet , trial of activated charcoal tablets , LOW FODMAP diet   Dr Jonathon Bellows  MD Follow up in 4 months

## 2016-04-24 DIAGNOSIS — R928 Other abnormal and inconclusive findings on diagnostic imaging of breast: Secondary | ICD-10-CM | POA: Diagnosis not present

## 2016-04-24 LAB — HM MAMMOGRAPHY

## 2016-05-07 ENCOUNTER — Encounter: Payer: Self-pay | Admitting: Internal Medicine

## 2016-05-07 DIAGNOSIS — R928 Other abnormal and inconclusive findings on diagnostic imaging of breast: Secondary | ICD-10-CM

## 2016-05-09 NOTE — Telephone Encounter (Signed)
Order placed for surgery referral.  

## 2016-05-09 NOTE — Telephone Encounter (Signed)
Called patient l/m to call office. Holding PPW at my desk

## 2016-05-09 NOTE — Telephone Encounter (Signed)
Pt called back. Please call her at [phone].

## 2016-05-14 ENCOUNTER — Encounter: Payer: Self-pay | Admitting: General Surgery

## 2016-05-14 ENCOUNTER — Ambulatory Visit (INDEPENDENT_AMBULATORY_CARE_PROVIDER_SITE_OTHER): Payer: 59 | Admitting: General Surgery

## 2016-05-14 ENCOUNTER — Inpatient Hospital Stay: Payer: Self-pay

## 2016-05-14 VITALS — BP 120/72 | HR 76 | Resp 12 | Ht 63.0 in | Wt 160.0 lb

## 2016-05-14 DIAGNOSIS — N6312 Unspecified lump in the right breast, upper inner quadrant: Secondary | ICD-10-CM

## 2016-05-14 DIAGNOSIS — N6001 Solitary cyst of right breast: Secondary | ICD-10-CM | POA: Insufficient documentation

## 2016-05-14 NOTE — Patient Instructions (Signed)
The patient is aware to call back for any questions or concerns.  

## 2016-05-14 NOTE — Progress Notes (Signed)
Patient ID: Sabrina Schneider, female   DOB: May 29, 1965, 51 y.o.   MRN: [id number]  Chief Complaint  Patient presents with  . Follow-up    mammogram    HPI PA TENNANT is a 51 y.o. female.  who presents for a breast evaluation. The most recent mammogram and ultrasound was done on 04-24-16. She can not feel anything different the breast. Denies any breast injury or trauma. Prior mammogram was 2016. Patient does not perform regular self breast checks and gets regular mammograms done.   She is perimenopausal, last menstrual period was March of 2018. She states that since January 2017 she has had 2 intervals where she was amenorrheic for 4 months at a time, only to have her menses return with a vengeance.  No clear association between her menstrual history and cyclic breast tenderness.   HPI  Past Medical History:  Diagnosis Date  . BRCA negative   . GERD (gastroesophageal reflux disease)   . Hiatal hernia     Past Surgical History:  Procedure Laterality Date  . BREAST CYST ASPIRATION  2010   Right breast cyst aspiration  . CESAREAN SECTION  03/18/2001  . COLONOSCOPY N/A 06/01/2014   Procedure: COLONOSCOPY;  Surgeon: Robert Bellow, MD;  Location[facility] ENDOSCOPY;  Service: Endoscopy;  Laterality: N/A;  . UPPER GI ENDOSCOPY  2015   Dr. Vira Agar    Family History  Problem Relation Age of Onset  . Hypertension Mother   . Diabetes Mother   . Breast cancer Daughter 29    BCRA2 mutation     Social History Social History  Substance Use Topics  . Smoking status: Never Smoker  . Smokeless tobacco: Never Used  . Alcohol use 0.0 oz/week     Comment: occasionally    No Known Allergies  Current Outpatient Prescriptions  Medication Sig Dispense Refill  . RABEprazole (ACIPHEX) 20 MG tablet Take 1 tablet (20 mg total) by mouth daily. 30 tablet 5  . ranitidine (ZANTAC) 150 MG tablet Take 1 tablet (150 mg total) by mouth at bedtime. 59 tablet 0  . triamcinolone cream (KENALOG) 0.1 %  Apply 1 application topically 2 (two) times daily. (Patient taking differently: Apply 1 application topically 2 (two) times daily as needed. ) 30 g 0   No current facility-administered medications for this visit.     Review of Systems Review of Systems  Constitutional: Negative.   Respiratory: Negative.   Cardiovascular: Negative.     Blood pressure 120/72, pulse 76, resp. rate 12, height 5' 3"  (1.6 m), weight 160 lb (72.6 kg), last menstrual period 03/18/2016.  Physical Exam Physical Exam  Constitutional: She is oriented to person, place, and time. She appears well-developed and well-nourished.  HENT:  Mouth/Throat: Oropharynx is clear and moist.  Eyes: Conjunctivae are normal. No scleral icterus.  Neck: Neck supple.  Cardiovascular: Normal rate, regular rhythm and normal heart sounds.   Pulmonary/Chest: Effort normal and breath sounds normal. Right breast exhibits mass. Right breast exhibits no inverted nipple, no nipple discharge, no skin change and no tenderness. Left breast exhibits no inverted nipple, no mass, no nipple discharge, no skin change and no tenderness.    Small nodule right breast 1 CFN and thickening at 2 o'clock 4 CFN  Lymphadenopathy:    She has no cervical adenopathy.    She has no axillary adenopathy.  Neurological: She is alert and oriented to person, place, and time.  Skin: Skin is warm and dry.  Psychiatric: Her behavior  is normal.    Data Reviewed Bilateral screening mammograms of 04/04/2016 as well as bilateral diagnostic mammogram dated 04/24/2016 as well as associated right breast ultrasound of the same date completed at [place] were reviewed. There was an obscure asymmetry in the right breast and ultrasound suggested a 3 mm oval mass likely a complex cyst versus solid lesion at the 9:00 position 2 cm from the nipple. Adjacent to this was a 9 mm anechoic/hypoechoic area. FNA sampling/biopsy of the 3 mm area was recommended with or without cyst  aspiration. BI-RADS-4.  Review of her November 2016 films completed at [facility] showed a round spherical density in the prepectoral area on the MLO view without an accompanying abnormality on the cc view. This was suggestive of a cyst based on entrance nature.  Ultrasound examination of the right breast was completed to assess the clinically noted thickening in the upper inner quadrant as well as to assess the previously identified areas in the 9:00 position for aspiration versus biopsy.  In the 2:00 position 5 cm from the nipple there is a multilobulated cyst measuring up to 1.65 cm in diameter. Strong posterior acoustic enhancement. Smooth borders. At the 2:00 position of the right breast a small softly lobulated anechoic mass consistent with a cyst measuring 0.27 x 0.56 x 0.63 cm is noted.  In the 9:00 position of the right breast possibly correlating to the focal thickening in the tissue below the areola a 0.34 x 0.4 cm nearly anechoic lesion with strong posterior acoustic enhancement is identified. This core lites with the area of concern on her recent [facility] study.  Below this in the prepectoral area a simple cyst measuring 0.5 x 0.67 x 0.71 and 0.43 x 0.77 x 0.88. These areas are anechoic, have strong posterior acoustic enhancement and smooth borders.  Moderate prominence of the retroareolar ductal structures were noted without any intraductal lesions identified.  The patient was amenable to FNA sampling of the lesion at the 9:00 position, 4 mm in maximum diameter that did not meet all criteria for simple cyst. 1 mL of 1% plain Xylocaine was used in the area was approached obliquely. The area resolved completely on aspiration. Slides 2 were prepared for cytology. The procedure was well tolerated. The focal thickening noted prior to aspiration was no longer clinically apparent. BI-RADS-3.   Assessment    Multiple breast cysts, likely secondary to menstrual irregularity in the  perimenopausal timeframe.    Plan    The patient will be contacted with FNA results. We'll have her return in 3 months for reassessment assuming benign exam.    HPI, Physical Exam, Assessment and Plan have been scribed under the direction and in the presence of Robert Bellow, MD.  Karie Fetch, RN  I have completed the exam and reviewed the above documentation for accuracy and completeness.  I agree with the above.  Haematologist has been used and any errors in dictation or transcription are unintentional.  Hervey Ard, M.D., F.A.C.S.  Robert Bellow 05/14/2016, 5:48 PM

## 2016-05-15 LAB — FINE-NEEDLE ASPIRATION

## 2016-05-16 ENCOUNTER — Telehealth: Payer: Self-pay

## 2016-05-16 NOTE — Telephone Encounter (Signed)
-----   Message from Robert Bellow, MD sent at 05/15/2016  4:46 PM EDT ----- Please notify the patient that the cytology results were benign, no suspicious findings. Follow-up as previously scheduled. Thank you ----- Message ----- From: Lavone Neri Lab Results In Sent: 05/15/2016   4:37 PM To: Robert Bellow, MD

## 2016-05-17 NOTE — Telephone Encounter (Signed)
Notified patient as instructed, patient pleased. Discussed follow-up appointments, patient agrees  

## 2016-06-29 ENCOUNTER — Other Ambulatory Visit: Payer: Self-pay | Admitting: Gastroenterology

## 2016-07-29 ENCOUNTER — Ambulatory Visit: Payer: 59 | Admitting: Gastroenterology

## 2016-08-09 ENCOUNTER — Encounter: Payer: Self-pay | Admitting: Internal Medicine

## 2016-08-09 ENCOUNTER — Ambulatory Visit (INDEPENDENT_AMBULATORY_CARE_PROVIDER_SITE_OTHER): Payer: 59 | Admitting: Internal Medicine

## 2016-08-09 VITALS — BP 124/70 | HR 76 | Temp 98.4°F | Resp 12 | Ht 62.4 in | Wt 158.0 lb

## 2016-08-09 DIAGNOSIS — Z1211 Encounter for screening for malignant neoplasm of colon: Secondary | ICD-10-CM | POA: Diagnosis not present

## 2016-08-09 DIAGNOSIS — Z0000 Encounter for general adult medical examination without abnormal findings: Secondary | ICD-10-CM

## 2016-08-09 DIAGNOSIS — E559 Vitamin D deficiency, unspecified: Secondary | ICD-10-CM

## 2016-08-09 DIAGNOSIS — K219 Gastro-esophageal reflux disease without esophagitis: Secondary | ICD-10-CM | POA: Diagnosis not present

## 2016-08-09 MED ORDER — RABEPRAZOLE SODIUM 20 MG PO TBEC: 20.0000 mg | DELAYED_RELEASE_TABLET | Freq: Every day | ORAL | 1 refills | Status: DC

## 2016-08-09 MED ORDER — RANITIDINE HCL 150 MG PO TABS: 150.0000 mg | ORAL_TABLET | Freq: Every day | ORAL | 1 refills | Status: DC

## 2016-08-09 NOTE — Assessment & Plan Note (Addendum)
Physical 08/09/16.  Saw Dr Bary Castilla 05/14/16 - s/p FNA.  Benign.  Saw gyn at [facility] for OB/gyn care.  Follow.

## 2016-08-09 NOTE — Progress Notes (Signed)
Pre-visit discussion using our clinic review tool. No additional management support is needed unless otherwise documented below in the visit note.  

## 2016-08-09 NOTE — Patient Instructions (Signed)
Restart aciphex 20mg  30 minutes before breakfast and zantac (ranitidine) 150mg  30 minutes before your evening meal.

## 2016-08-09 NOTE — Progress Notes (Signed)
Patient ID: Sabrina Schneider, female   DOB: July 10, 1965, 51 y.o.   MRN: [id number]   Subjective:    Patient ID: Sabrina Schneider, female    DOB: 03-Jun-1965, 51 y.o.   MRN: [id number]  HPI  Patient here for her physical exam.  She just recently saw Dr Bary Castilla for abnormal mammogram.  S/p FNA 05/14/16.  Biopsy ok.  Has f/u planned in 08/2016.  Tries to stay active.  No chest pain.  No sob.  No abdominal pain.  Bowels moving.  No urine change.  Some acid reflux.  Not taking her medications regularly.  Discussed restarting.  Goes to [facility] for her gyn care.  Is up to date.     Past Medical History:  Diagnosis Date  . BRCA negative   . GERD (gastroesophageal reflux disease)   . Hiatal hernia    Past Surgical History:  Procedure Laterality Date  . BREAST CYST ASPIRATION  2010   Right breast cyst aspiration  . CESAREAN SECTION  03/18/2001  . COLONOSCOPY N/A 06/01/2014   Procedure: COLONOSCOPY;  Surgeon: Robert Bellow, MD;  Location: [facility] ENDOSCOPY;  Service: Endoscopy;  Laterality: N/A;  . UPPER GI ENDOSCOPY  2015   Dr. Vira Agar   Family History  Problem Relation Age of Onset  . Hypertension Mother   . Diabetes Mother   . Breast cancer Daughter 55       BCRA2 mutation    Social History   Social History  . Marital status: Single    Spouse name: N/A  . Number of children: N/A  . Years of education: N/A   Social History Main Topics  . Smoking status: Never Smoker  . Smokeless tobacco: Never Used  . Alcohol use 0.0 oz/week     Comment: occasionally  . Drug use: No  . Sexual activity: Not Asked   Other Topics Concern  . None   Social History Narrative  . None    Outpatient Encounter Prescriptions as of 08/09/2016  Medication Sig  . RABEprazole (ACIPHEX) 20 MG tablet Take 1 tablet (20 mg total) by mouth daily.  . ranitidine (ZANTAC) 150 MG tablet Take 1 tablet (150 mg total) by mouth at bedtime.  . triamcinolone cream (KENALOG) 0.1 % Apply 1 application topically 2 (two) times  daily.  . [DISCONTINUED] RABEprazole (ACIPHEX) 20 MG tablet Take 1 tablet (20 mg total) by mouth daily.  . [DISCONTINUED] ranitidine (ZANTAC) 150 MG tablet TAKE 1 TABLET BY MOUTH AT BEDTIME   No facility-administered encounter medications on file as of 08/09/2016.     Review of Systems  Constitutional: Negative for appetite change and unexpected weight change.  HENT: Negative for congestion and sinus pressure.   Eyes: Negative for pain and visual disturbance.  Respiratory: Negative for cough, chest tightness and shortness of breath.   Cardiovascular: Negative for chest pain, palpitations and leg swelling.  Gastrointestinal: Negative for abdominal pain, diarrhea, nausea and vomiting.  Genitourinary: Negative for difficulty urinating and dysuria.  Musculoskeletal: Negative for back pain and joint swelling.  Skin: Negative for color change and rash.  Neurological: Negative for dizziness, light-headedness and headaches.  Hematological: Negative for adenopathy. Does not bruise/bleed easily.  Psychiatric/Behavioral: Negative for agitation and dysphoric mood.       Objective:    Physical Exam  Constitutional: She is oriented to person, place, and time. She appears well-developed and well-nourished. No distress.  HENT:  Nose: Nose normal.  Mouth/Throat: Oropharynx is clear and moist.  Eyes: Right eye  exhibits no discharge. Left eye exhibits no discharge. No scleral icterus.  Neck: Neck supple. No thyromegaly present.  Cardiovascular: Normal rate and regular rhythm.   Pulmonary/Chest: Breath sounds normal. No accessory muscle usage. No tachypnea. No respiratory distress. She has no decreased breath sounds. She has no wheezes. She has no rhonchi. Right breast exhibits no inverted nipple, no mass, no nipple discharge and no tenderness (no axillary adenopathy). Left breast exhibits no inverted nipple, no mass, no nipple discharge and no tenderness (no axilarry adenopathy).  Abdominal: Soft.  Bowel sounds are normal. There is no tenderness.  Musculoskeletal: She exhibits no edema or tenderness.  Lymphadenopathy:    She has no cervical adenopathy.  Neurological: She is alert and oriented to person, place, and time.  Skin: Skin is warm. No rash noted. No erythema.  Psychiatric: She has a normal mood and affect. Her behavior is normal.    BP 124/70 (BP Location: Left Arm, Patient Position: Sitting, Cuff Size: Normal)   Pulse 76   Temp 98.4 F (36.9 C) (Oral)   Resp 12   Ht 5' 2.4" (1.585 m)   Wt 158 lb (71.7 kg)   SpO2 98%   BMI 28.53 kg/m  Wt Readings from Last 3 Encounters:  08/09/16 158 lb (71.7 kg)  05/14/16 160 lb (72.6 kg)  04/10/16 157 lb 9.6 oz (71.5 kg)     Lab Results  Component Value Date   WBC 4.6 02/06/2016   HGB 11.9 02/06/2016   HCT 35.6 02/06/2016   PLT 293 02/06/2016   GLUCOSE 92 02/23/2016   CHOL 145 11/07/2015   TRIG 88 11/07/2015   HDL 55 11/07/2015   LDLCALC 72 11/07/2015   ALT 13 02/06/2016   AST 15 02/06/2016   NA 139 02/23/2016   K 4.2 02/23/2016   CL 101 02/23/2016   CREATININE 0.66 02/23/2016   BUN 9 02/23/2016   CO2 24 02/23/2016   TSH 1.720 03/12/2016   HGBA1C 5.2 11/27/2015       Assessment & Plan:   Problem List Items Addressed This Visit    Encounter for screening colonoscopy    S/p colonoscopy 05/2014.  Recommended f/u colonoscopy in 10 years.       GERD (gastroesophageal reflux disease)    Acid reflux.  Restart aciphex and zantac.  Get her back in soon to reassess.        Relevant Medications   RABEprazole (ACIPHEX) 20 MG tablet   ranitidine (ZANTAC) 150 MG tablet   Healthcare maintenance    Physical 08/09/16.  Saw Dr Bary Castilla 05/14/16 - s/p FNA.  Benign.  Saw gyn at [facility] for OB/gyn care.  Follow.        Vitamin D deficiency    Follow vitamin d level.           Einar Pheasant, MD

## 2016-08-11 ENCOUNTER — Encounter: Payer: Self-pay | Admitting: Internal Medicine

## 2016-08-11 NOTE — Assessment & Plan Note (Signed)
Acid reflux.  Restart aciphex and zantac.  Get her back in soon to reassess.

## 2016-08-11 NOTE — Assessment & Plan Note (Signed)
Follow vitamin d level.   

## 2016-08-11 NOTE — Assessment & Plan Note (Signed)
S/p colonoscopy 05/2014.  Recommended f/u colonoscopy in 10 years.

## 2016-08-12 ENCOUNTER — Telehealth: Payer: Self-pay | Admitting: Internal Medicine

## 2016-08-12 NOTE — Telephone Encounter (Signed)
Lm on vm to schedule appt in about 10 weeks around the week of 10/21/16.

## 2016-08-14 ENCOUNTER — Ambulatory Visit: Payer: 59 | Admitting: General Surgery

## 2016-10-23 ENCOUNTER — Encounter: Payer: Self-pay | Admitting: *Deleted

## 2017-01-06 ENCOUNTER — Ambulatory Visit: Payer: 59 | Admitting: Obstetrics and Gynecology

## 2017-01-09 ENCOUNTER — Ambulatory Visit (INDEPENDENT_AMBULATORY_CARE_PROVIDER_SITE_OTHER): Payer: 59 | Admitting: Certified Nurse Midwife

## 2017-01-09 ENCOUNTER — Encounter: Payer: Self-pay | Admitting: Certified Nurse Midwife

## 2017-01-09 VITALS — BP 120/76 | HR 83 | Ht 63.0 in | Wt 164.0 lb

## 2017-01-09 DIAGNOSIS — Z124 Encounter for screening for malignant neoplasm of cervix: Secondary | ICD-10-CM | POA: Diagnosis not present

## 2017-01-09 DIAGNOSIS — N393 Stress incontinence (female) (male): Secondary | ICD-10-CM

## 2017-01-09 DIAGNOSIS — R350 Frequency of micturition: Secondary | ICD-10-CM | POA: Diagnosis not present

## 2017-01-09 DIAGNOSIS — Z1231 Encounter for screening mammogram for malignant neoplasm of breast: Secondary | ICD-10-CM

## 2017-01-09 DIAGNOSIS — Z01419 Encounter for gynecological examination (general) (routine) without abnormal findings: Secondary | ICD-10-CM | POA: Diagnosis not present

## 2017-01-09 DIAGNOSIS — Z1239 Encounter for other screening for malignant neoplasm of breast: Secondary | ICD-10-CM

## 2017-01-09 LAB — POCT URINALYSIS DIPSTICK
BILIRUBIN UA: NEGATIVE
Glucose, UA: NEGATIVE
KETONES UA: NEGATIVE
Leukocytes, UA: NEGATIVE
NITRITE UA: NEGATIVE
PH UA: 6 (ref 5.0–8.0)
UROBILINOGEN UA: 0.2 U/dL

## 2017-01-09 NOTE — Progress Notes (Signed)
Gynecology Annual Exam  PCP: Einar Pheasant, MD  Chief Complaint:  Chief Complaint  Patient presents with  . Gynecologic Exam    History of Present Illness:Sabrina Schneider presents today for her annual exam. She is a 51 year old African American/Black female, G4 P16, menstruating female. She is having problems with urinary frequency, voiding small amounts and SUI off and on for "a long time". Reports voiding 4-5 times over a 10-12 hour period at work and 2 times during the night. She has one 10-12 oz cup of coffee in the AM, an occasional soda and 2 bottles of water during the day. Her evening meal when she gets out of work is at 8-9 PM.     Last Pap: 12/29/2015 Result: NORMAL, HPV Negative Ever had a abnormal pap no  Last Mammogram: 04/09/2016 Result: BIRADS 4. FNA of area in question-resolved the mass/cyst-05/14/2016 Last Colonoscopy: 2016 - Normal-next due in 2026  Last DEXA: never Findings: N/A  Osteoporosis Prevention: Exercise-before weather got bad.. Dietary intake of calcium and vitamin D3 is not adequate. Contraception Method:  None, infrequent IC Her menses are irregular, every 1-4 mos apart, lasting 4 days, with one heavier day requiring pad change every 1-2 hours and are without clots. Some mild cramping, not usually requiring analgesia. LMP 10/11/2016 Some hot flashes  The patient's past medical history is remarkable for a history of anemia and GERD. Since her last annual GYN exam 12/29/2015, she has had no significant changes in her health history. She is sexually active and reports no problems with intercourse..  There is a positive history a breast cancer in her daughter at age 68. The daughter tested positive for BRCA. The patient was negative for BRCA. The patient does not do monthly self breast exams.  The patient rarely drinks alcohol and does not smoke. The patient does usually exercises regularly but has backed off exercising when the weather was bad. She had a  recent lipid panel that she reports was normal.  The patient denies current symptoms of depression.    Review of Systems: Review of Systems  Constitutional: Negative for chills, fever and weight loss.       Positive for 10# weight gain  HENT: Negative for congestion, sinus pain and sore throat.   Eyes: Negative for blurred vision and pain.  Respiratory: Negative for hemoptysis, shortness of breath and wheezing.   Cardiovascular: Negative for chest pain, palpitations and leg swelling.  Gastrointestinal: Positive for heartburn. Negative for abdominal pain, blood in stool, diarrhea, nausea and vomiting.  Genitourinary: Positive for frequency. Negative for dysuria, hematuria and urgency.       Positive for voiding in small amounts and nocturia. Positive for SUI and irregular menses  Musculoskeletal: Negative for back pain, joint pain and myalgias.  Skin: Negative for itching and rash.  Neurological: Negative for dizziness, tingling and headaches.  Endo/Heme/Allergies: Negative for environmental allergies and polydipsia. Does not bruise/bleed easily.       Negative for hirsutism. Positive for vasomotor symptoms.   Psychiatric/Behavioral: Negative for depression. The patient is not nervous/anxious and does not have insomnia.     Past Medical History:  Past Medical History:  Diagnosis Date  . Anemia   . BRCA negative   . Family history of breast cancer    daughter had breast cancer at age 53-BRCA positive. Patient is BRCA negative  . GERD (gastroesophageal reflux disease)   . Hiatal hernia     Past Surgical History:  Past  Surgical History:  Procedure Laterality Date  . BREAST CYST ASPIRATION  2010/ 2018   Right breast cyst aspiration  . CESAREAN SECTION  03/18/2001   fetal indications  . COLONOSCOPY N/A 06/01/2014   Procedure: COLONOSCOPY;  Surgeon: Robert Bellow, MD;  Location: [facility] ENDOSCOPY;  Service: Endoscopy;  Laterality: N/A;  . DILATION AND CURETTAGE OF UTERUS     tab    . UPPER GI ENDOSCOPY  2015   Dr. Vira Agar    Family History:  Family History  Problem Relation Age of Onset  . Lung cancer Father 25  . Hypertension Mother   . Diabetes Mother   . Heart disease Mother   . Breast cancer Daughter 52       BCRA2 mutation   . Diabetes Sister   . Cervical cancer Sister   . Prostate cancer Maternal Grandfather 5  . Diabetes Sister   . Cervical cancer Sister 47    Social History:  Social History   Socioeconomic History  . Marital status: Single    Spouse name: Not on file  . Number of children: 2  . Years of education: Not on file  . Highest education level: Associate degree: academic program  Social Needs  . Financial resource strain: Not on file  . Food insecurity - worry: Not on file  . Food insecurity - inability: Not on file  . Transportation needs - medical: Not on file  . Transportation needs - non-medical: Not on file  Occupational History  . Occupation: [name]  Tobacco Use  . Smoking status: Never Smoker  . Smokeless tobacco: Never Used  Substance and Sexual Activity  . Alcohol use: Yes    Alcohol/week: 0.0 oz    Comment: occasionally  . Drug use: No  . Sexual activity: Yes    Partners: Male    Birth control/protection: None  Other Topics Concern  . Not on file  Social History Narrative  . Not on file    Allergies:  No Known Allergies  Medications: Prior to Admission medications   Medication Sig Start Date End Date Taking? Authorizing Provider  RABEprazole (ACIPHEX) 20 MG tablet Take 1 tablet (20 mg total) by mouth daily. 08/09/16   Einar Pheasant, MD  ranitidine (ZANTAC) 150 MG tablet Take 1 tablet (150 mg total) by mouth at bedtime. 08/09/16   Einar Pheasant, MD  triamcinolone cream (KENALOG) 0.1 % Apply 1 application topically 2 (two) times daily. 11/07/15   Einar Pheasant, MD    Physical Exam Vitals: BP 120/76   Pulse 83   Ht 5' 3" (1.6 m)   Wt 164 lb (74.4 kg)   LMP 10/10/2016  (Exact Date)   BMI 29.05 kg/m   General: AAF in  NAD HEENT: normocephalic, anicteric Neck: no thyroid enlargement, no palpable nodules, no cervical lymphadenopathy  Pulmonary: No increased work of breathing, CTAB Cardiovascular: RRR, without murmur  Breast: Density in both breasts in the from 11-1 o'clock with right>left, no tenderness, no dominant nodules or masses, no skin or nipple retraction present, no nipple discharge.  No axillary, infraclavicular or supraclavicular lymphadenopathy. Abdomen: Soft, non-tender, non-distended.  Umbilicus without lesions.  No hepatomegaly or masses palpable. No evidence of hernia. Genitourinary:  External: Normal external female genitalia.  Normal urethral meatus, normal Bartholin's and Skene's glands.    Vagina: Normal vaginal mucosa, urethrocele and rectocele present with Valsalva   Cervix: Grossly normal in appearance, no bleeding, non-tender  Uterus: Anteverted, normal size, shape, and consistency, mobile,  and non-tender  Adnexa: No adnexal masses, non-tender  Rectal: deferred  Lymphatic: no evidence of inguinal lymphadenopathy Extremities: no edema, erythema, or tenderness Neurologic: Grossly intact Psychiatric: mood appropriate, affect full  Results for orders placed or performed in visit on 01/09/17 (from the past 24 hour(s))  POCT Urinalysis Dipstick     Status: Abnormal   Collection Time: 01/09/17  4:14 PM  Result Value Ref Range   Color, UA yellow    Clarity, UA clear    Glucose, UA negative    Bilirubin, UA negative    Ketones, UA negative    Spec Grav, UA >=1.030 (A) 1.010 - 1.025   Blood, UA small    pH, UA 6.0 5.0 - 8.0   Protein, UA trace    Urobilinogen, UA 0.2 0.2 or 1.0 E.U./dL   Nitrite, UA negative    Leukocytes, UA Negative Negative   Appearance     Odor       Assessment: 51 y.o. [id number] annual gyn exam Urinary frequency/ voiding small amounts  Negative dipstick  Urine culture ordered  Encouraged to drink more  water SUI-instructed in Kegel exercises Perimenopausal bleeding   Plan:    1) Breast cancer screening - recommend monthly self breast exam and annual screening mammograms. PCP has already ordered next mammogram due in late March.  2) Colon cancer screening-UTD until 2026  3) Cervical cancer screening - Pap was done.   4) Osteoporosis prevention: Recommend starting calcium and vitamin D3 supplement (calcium citrate specifically). Also encouraged exercise to help prevent osteoporosis  5) Routine healthcare maintenance including cholesterol and diabetes screening managed by PCP   6) RTO 1 year or prn  Dalia Heading, CNM

## 2017-01-09 NOTE — Patient Instructions (Signed)
Kegel Exercises Kegel exercises help strengthen the muscles that support the rectum, vagina, small intestine, bladder, and uterus. Doing Kegel exercises can help:  Improve bladder and bowel control.  Improve sexual response.  Reduce problems and discomfort during pregnancy.  Kegel exercises involve squeezing your pelvic floor muscles, which are the same muscles you squeeze when you try to stop the flow of urine. The exercises can be done while sitting, standing, or lying down, but it is best to vary your position. Phase 1 exercises 1. Squeeze your pelvic floor muscles tight. You should feel a tight lift in your rectal area. If you are a female, you should also feel a tightness in your vaginal area. Keep your stomach, buttocks, and legs relaxed. 2. Hold the muscles tight for up to 10 seconds. 3. Relax your muscles. Repeat this exercise 50 times a day or as many times as told by your health care provider. Continue to do this exercise for at least 4-6 weeks or for as long as told by your health care provider. This information is not intended to replace advice given to you by your health care provider. Make sure you discuss any questions you have with your health care provider. Document Released: 12/18/2011 Document Revised: 08/26/2015 Document Reviewed: 11/20/2014 Elsevier Interactive Patient Education  2018 [employer]. Preventing Osteoporosis, Adult Osteoporosis is a condition that causes the bones to get weaker. With osteoporosis, the bones become thinner, and the normal spaces in bone tissue become larger. This can make the bones weak and cause them to break more easily. People who have osteoporosis are more likely to break their wrist, spine, or hip. Even a minor accident or injury can be enough to break weak bones. Osteoporosis can occur with aging. Your body constantly replaces old bone tissue with new tissue. As you get older, you may lose bone tissue more quickly, or it may be replaced  more slowly. Osteoporosis is more likely to develop if you have poor nutrition or do not get enough calcium or vitamin D. Other lifestyle factors can also play a role. By making some diet and lifestyle changes, you can help to keep your bones healthy and help to prevent osteoporosis. What nutrition changes can be made? Nutrition plays an important role in maintaining healthy, strong bones.  Make sure you get enough calcium every day from food or from calcium supplements. ? If you are age 73 or younger, aim to get 1,000 mg of calcium every day. ? If you are older than age 73, aim to get 1,200 mg of calcium every day.  Try to get enough vitamin D every day. ? If you are age 69 or younger, aim to get 600 international units (IU) every day. ? If you are older than age 62, aim to get 800 international units every day.  Follow a healthy diet. Eat plenty of foods that contain calcium and vitamin D. ? Calcium is in milk, cheese, yogurt, and other dairy products. Some fish and vegetables are also good sources of calcium. Many foods such as cereals and breads have had calcium added to them (are fortified). Check nutrition labels to see how much calcium is in a food or drink. ? Foods that contain vitamin D include milk, cereals, salmon, and tuna. Your body also makes vitamin D when you are out in the sun. Bare skin exposure to the sun on your face, arms, legs, or back for no more than 30 minutes a day, 2 times per week is more  than enough. Beyond that, it is important to use sunblock to protect your skin from sunburn, which increases your risk for skin cancer.  What lifestyle changes can be made? Making changes in your everyday life can also play an important role in preventing osteoporosis.  Stay active and get exercise every day. Ask your health care provider what types of exercise are best for you.  Do not use any products that contain nicotine or tobacco, such as cigarettes and e-cigarettes. If you  need help quitting, ask your health care provider.  Limit alcohol intake to no more than 1 drink a day for nonpregnant women and 2 drinks a day for men. One drink equals 12 oz of beer, 5 oz of wine, or 1 oz of hard liquor.  Why are these changes important? Making these nutrition and lifestyle changes can:  Help you develop and maintain healthy, strong bones.  Prevent loss of bone mass and the problems that are caused by that loss, such as broken bones and delayed healing.  Make you feel better mentally and physically.  What can happen if changes are not made? Problems that can result from osteoporosis can be very serious. These may include:  A higher risk of broken bones that are painful and do not heal well.  Physical malformations, such as a collapsed spine or a hunched back.  Problems with movement.  Where to find support: If you need help making changes to prevent osteoporosis, talk with your health care provider. You can ask for a referral to a diet and nutrition specialist (dietitian) and a physical therapist. Where to find more information: Learn more about osteoporosis from:  NIH Osteoporosis and Related [place]: www.niams.GolfingGoddess.com.br  U.S. Office on Women's Health: SouvenirBaseball.es.html  National Osteoporosis Foundation: ProfilePeek.ch  Summary  Osteoporosis is a condition that causes weak bones that are more likely to break.  Eating a healthy diet and making sure you get enough calcium and vitamin D can help prevent osteoporosis.  Other ways to reduce your risk of osteoporosis include getting regular exercise and avoiding alcohol and products that contain nicotine or tobacco. This information is not intended to replace advice given to you by your health care provider. Make sure you discuss any questions  you have with your health care provider. Document Released: 01/15/2015 Document Revised: 09/11/2015 Document Reviewed: 09/11/2015 Elsevier Interactive Patient Education  [employer].

## 2017-01-10 ENCOUNTER — Encounter: Payer: Self-pay | Admitting: Certified Nurse Midwife

## 2017-01-11 LAB — IGP,RFX APTIMA HPV ALL PTH: PAP SMEAR COMMENT: 0

## 2017-01-11 LAB — URINE CULTURE

## 2017-01-24 ENCOUNTER — Ambulatory Visit: Payer: 59 | Admitting: Obstetrics and Gynecology

## 2017-02-07 ENCOUNTER — Other Ambulatory Visit: Payer: Self-pay | Admitting: Gastroenterology

## 2017-04-08 ENCOUNTER — Other Ambulatory Visit: Payer: Self-pay | Admitting: Internal Medicine

## 2017-08-21 ENCOUNTER — Ambulatory Visit (INDEPENDENT_AMBULATORY_CARE_PROVIDER_SITE_OTHER): Payer: 59 | Admitting: Internal Medicine

## 2017-08-21 ENCOUNTER — Other Ambulatory Visit: Payer: Self-pay | Admitting: Internal Medicine

## 2017-08-21 ENCOUNTER — Encounter: Payer: Self-pay | Admitting: Internal Medicine

## 2017-08-21 DIAGNOSIS — Z1231 Encounter for screening mammogram for malignant neoplasm of breast: Secondary | ICD-10-CM

## 2017-08-21 DIAGNOSIS — E559 Vitamin D deficiency, unspecified: Secondary | ICD-10-CM | POA: Diagnosis not present

## 2017-08-21 DIAGNOSIS — Z0000 Encounter for general adult medical examination without abnormal findings: Secondary | ICD-10-CM

## 2017-08-21 DIAGNOSIS — K219 Gastro-esophageal reflux disease without esophagitis: Secondary | ICD-10-CM | POA: Diagnosis not present

## 2017-08-21 DIAGNOSIS — Z1239 Encounter for other screening for malignant neoplasm of breast: Secondary | ICD-10-CM

## 2017-08-21 DIAGNOSIS — R928 Other abnormal and inconclusive findings on diagnostic imaging of breast: Secondary | ICD-10-CM

## 2017-08-21 LAB — LIPID PANEL
Cholesterol: 144 (ref 0–200)
HDL: 54 (ref 35–70)
LDL CALC: 75
Triglycerides: 74 (ref 40–160)

## 2017-08-21 LAB — HEMOGLOBIN A1C: HEMOGLOBIN A1C: 5.4 (ref 4.0–6.0)

## 2017-08-21 LAB — BASIC METABOLIC PANEL
Creatinine: 1 (ref 0.5–1.1)
GLUCOSE: 92

## 2017-08-21 MED ORDER — RABEPRAZOLE SODIUM 20 MG PO TBEC: 20.0000 mg | DELAYED_RELEASE_TABLET | Freq: Two times a day (BID) | ORAL | 1 refills | Status: DC

## 2017-08-21 NOTE — Progress Notes (Signed)
Opened in error

## 2017-08-21 NOTE — Progress Notes (Signed)
Patient ID: Sabrina Schneider, female   DOB: Jan 31, 1965, 52 y.o.   MRN: [id number]   Subjective:    Patient ID: Sabrina Schneider, female    DOB: 02-17-1965, 52 y.o.   MRN: [id number]  HPI  Patient here for her physical exam.  Saw gyn 01/09/17.  Had pap 12/29/15 - normal with negative HPV.  She hs been taking aciphex and zantac.  Has still had issues with persistent acid reflux.  Also occasionally will have emesis associated.  No abdominal pain.  No chest pain.  No sob.  Bowels moving.  Saw Dr Bary Castilla 05/2016.  FNA - ok.  Never followed back up with Dr Bary Castilla.  Overdue f/u mammogram.      Past Medical History:  Diagnosis Date  . Anemia   . BRCA negative   . Family history of breast cancer    daughter had breast cancer at age 39-BRCA positive. Patient is BRCA negative  . GERD (gastroesophageal reflux disease)   . Hiatal hernia    Past Surgical History:  Procedure Laterality Date  . BREAST CYST ASPIRATION  2010/ 2018   Right breast cyst aspiration  . CESAREAN SECTION  03/18/2001   fetal indications  . COLONOSCOPY N/A 06/01/2014   Procedure: COLONOSCOPY;  Surgeon: Robert Bellow, MD;  Location: [facility] ENDOSCOPY;  Service: Endoscopy;  Laterality: N/A;  . DILATION AND CURETTAGE OF UTERUS     tab  . UPPER GI ENDOSCOPY  2015   Dr. Vira Agar   Family History  Problem Relation Age of Onset  . Lung cancer Father 81  . Hypertension Mother   . Diabetes Mother   . Heart disease Mother   . Breast cancer Daughter 58       BCRA2 mutation   . Diabetes Sister   . Cervical cancer Sister   . Prostate cancer Maternal Grandfather 54  . Diabetes Sister   . Cervical cancer Sister 31   Social History   Socioeconomic History  . Marital status: Single    Spouse name: Not on file  . Number of children: 2  . Years of education: Not on file  . Highest education level: Associate degree: academic program  Occupational History  . Occupation: Patient Therapist, art Rep  Social Needs  . Financial  resource strain: Not on file  . Food insecurity:    Worry: Not on file    Inability: Not on file  . Transportation needs:    Medical: Not on file    Non-medical: Not on file  Tobacco Use  . Smoking status: Never Smoker  . Smokeless tobacco: Never Used  Substance and Sexual Activity  . Alcohol use: Yes    Alcohol/week: 0.0 standard drinks    Comment: occasionally  . Drug use: No  . Sexual activity: Yes    Partners: Male    Birth control/protection: None  Lifestyle  . Physical activity:    Days per week: 0 days    Minutes per session: 0 min  . Stress: Only a little  Relationships  . Social connections:    Talks on phone: Three times a week    Gets together: Once a week    Attends religious service: More than 4 times per year    Active member of club or organization: No    Attends meetings of clubs or organizations: Never    Relationship status: Never married  Other Topics Concern  . Not on file  Social History Narrative  . Not on file  Outpatient Encounter Medications as of 08/21/2017  Medication Sig  . ranitidine (ZANTAC) 150 MG tablet TAKE 1 TABLET BY MOUTH AT BEDTIME  . ranitidine (ZANTAC) 150 MG tablet TAKE 1 TABLET BY MOUTH AT  BEDTIME  . [DISCONTINUED] RABEprazole (ACIPHEX) 20 MG tablet TAKE 1 TABLET BY MOUTH  DAILY  . [DISCONTINUED] RABEprazole (ACIPHEX) 20 MG tablet Take 1 tablet (20 mg total) by mouth 2 (two) times daily before a meal.  . triamcinolone cream (KENALOG) 0.1 % Apply 1 application topically 2 (two) times daily. (Patient not taking: Reported on 08/21/2017)   No facility-administered encounter medications on file as of 08/21/2017.     Review of Systems  Constitutional: Negative for appetite change and unexpected weight change.  HENT: Negative for congestion and sinus pressure.   Eyes: Negative for pain and visual disturbance.  Respiratory: Negative for cough, chest tightness and shortness of breath.   Cardiovascular: Negative for chest pain,  palpitations and leg swelling.  Gastrointestinal: Negative for abdominal pain, diarrhea, nausea and vomiting.  Genitourinary: Negative for difficulty urinating and dysuria.  Musculoskeletal: Negative for joint swelling and myalgias.  Skin: Negative for color change and rash.  Neurological: Negative for dizziness, light-headedness and headaches.  Hematological: Negative for adenopathy. Does not bruise/bleed easily.  Psychiatric/Behavioral: Negative for agitation and dysphoric mood.       Objective:    Physical Exam  Constitutional: She is oriented to person, place, and time. She appears well-developed and well-nourished. No distress.  HENT:  Nose: Nose normal.  Mouth/Throat: Oropharynx is clear and moist.  Eyes: Right eye exhibits no discharge. Left eye exhibits no discharge. No scleral icterus.  Neck: Neck supple. No thyromegaly present.  Cardiovascular: Normal rate and regular rhythm.  Pulmonary/Chest: Breath sounds normal. No accessory muscle usage. No tachypnea. No respiratory distress. She has no decreased breath sounds. She has no wheezes. She has no rhonchi. Right breast exhibits no inverted nipple, no mass, no nipple discharge and no tenderness (no axillary adenopathy). Left breast exhibits no inverted nipple, no mass, no nipple discharge and no tenderness (no axilarry adenopathy).  Abdominal: Soft. Bowel sounds are normal. There is no tenderness.  Musculoskeletal: She exhibits no edema or tenderness.  Lymphadenopathy:    She has no cervical adenopathy.  Neurological: She is alert and oriented to person, place, and time.  Skin: No rash noted. No erythema.  Psychiatric: She has a normal mood and affect. Her behavior is normal.    BP 122/80 (BP Location: Left Arm, Patient Position: Sitting, Cuff Size: Normal)   Pulse 76   Temp 98.7 F (37.1 C) (Oral)   Resp 18   Ht 5' 3"  (1.6 m)   Wt 158 lb (71.7 kg)   SpO2 98%   BMI 27.99 kg/m  Wt Readings from Last 3 Encounters:    08/21/17 158 lb (71.7 kg)  01/09/17 164 lb (74.4 kg)  08/09/16 158 lb (71.7 kg)     Lab Results  Component Value Date   WBC 4.6 02/06/2016   HGB 11.9 02/06/2016   HCT 35.6 02/06/2016   PLT 293 02/06/2016   GLUCOSE 92 02/23/2016   CHOL 145 11/07/2015   TRIG 88 11/07/2015   HDL 55 11/07/2015   LDLCALC 72 11/07/2015   ALT 13 02/06/2016   AST 15 02/06/2016   NA 139 02/23/2016   K 4.2 02/23/2016   CL 101 02/23/2016   CREATININE 0.66 02/23/2016   BUN 9 02/23/2016   CO2 24 02/23/2016   TSH 1.720 03/12/2016  HGBA1C 5.2 11/27/2015    Ct Abdomen Pelvis W Contrast  Result Date: 02/21/2016 CLINICAL DATA:  Intermittent lower abdominal pain for 1-2 years. Bloating. EXAM: CT ABDOMEN AND PELVIS WITH CONTRAST TECHNIQUE: Multidetector CT imaging of the abdomen and pelvis was performed using the standard protocol following bolus administration of intravenous contrast. CONTRAST:  124m ISOVUE-300 IOPAMIDOL (ISOVUE-300) INJECTION 61% COMPARISON:  None. FINDINGS: Lower chest: Small hiatal hernia.  Otherwise normal. Hepatobiliary: Liver parenchyma is normal. No dilated bile ducts. Inhomogeneity of the gallbladder suggests stones but this is not definitive. Pancreas: Unremarkable. No pancreatic ductal dilatation or surrounding inflammatory changes. Spleen: Normal in size without focal abnormality. Adrenals/Urinary Tract: Adrenal glands are unremarkable. Kidneys are normal, without renal calculi, focal lesion, or hydronephrosis. Bladder is unremarkable. Stomach/Bowel: Stomach is within normal limits except for small hiatal hernia. Appendix appears normal. No evidence of bowel wall thickening, distention, or inflammatory changes.Small hiatal hernia. Vascular/Lymphatic: No significant vascular findings are present. No enlarged abdominal or pelvic lymph nodes. Reproductive: Uterus and bilateral adnexa are unremarkable. Other: No abdominal wall hernia or abnormality. No abdominopelvic ascites. Musculoskeletal:  No acute or significant osseous findings. IMPRESSION: Benign-appearing abdomen and pelvis. Electronically Signed   By: JLorriane ShireM.D.   On: 02/21/2016 09:12       Assessment & Plan:   Problem List Items Addressed This Visit    Abnormal mammogram    Previous abnormal mammogram.  Saw Dr BBary Castilla FNA 05/2016 - normal.  Overdue f/u mammogram.  Schedule.        GERD (gastroesophageal reflux disease)    Persistent acid reflux.  On aciphex and zantac.  Given persistent symptoms despite medications, will refer to GI for evaluation and question of need for EGD.        Relevant Orders   Ambulatory referral to Gastroenterology   Healthcare maintenance    Physical today 08/21/17.  Saw Dr BBary Castilla5/1/18 - FNA - benign.  Did not f/u with mammogram.  Will schedule.  Request screening.  Colonoscopy 2016.        Relevant Orders   CBC with Differential/Platelet   Comprehensive metabolic panel   TSH   Urinalysis, Routine w reflex microscopic (Completed)   Vitamin D deficiency    Follow vitamin D level.        Relevant Orders   VITAMIN D 25 Hydroxy (Vit-D Deficiency, Fractures)    Other Visit Diagnoses    Breast cancer screening       Relevant Orders   MM 3D SCREEN BREAST BILATERAL       SEinar Pheasant MD

## 2017-08-21 NOTE — Assessment & Plan Note (Signed)
Physical today 08/21/17.  Saw Dr Bary Castilla 05/14/16 - FNA - benign.  Did not f/u with mammogram.  Will schedule.  Request screening.  Colonoscopy 2016.

## 2017-08-22 ENCOUNTER — Other Ambulatory Visit: Payer: Self-pay

## 2017-08-22 ENCOUNTER — Encounter: Payer: Self-pay | Admitting: Internal Medicine

## 2017-08-22 DIAGNOSIS — Z853 Personal history of malignant neoplasm of breast: Secondary | ICD-10-CM

## 2017-08-22 DIAGNOSIS — Z1239 Encounter for other screening for malignant neoplasm of breast: Secondary | ICD-10-CM

## 2017-08-22 DIAGNOSIS — R928 Other abnormal and inconclusive findings on diagnostic imaging of breast: Secondary | ICD-10-CM

## 2017-08-22 LAB — MICROSCOPIC EXAMINATION: Casts: NONE SEEN /lpf

## 2017-08-22 LAB — URINALYSIS, ROUTINE W REFLEX MICROSCOPIC
BILIRUBIN UA: NEGATIVE
GLUCOSE, UA: NEGATIVE
Ketones, UA: NEGATIVE
LEUKOCYTES UA: NEGATIVE
Nitrite, UA: NEGATIVE
PROTEIN UA: NEGATIVE
Specific Gravity, UA: 1.018 (ref 1.005–1.030)
UUROB: 0.2 mg/dL (ref 0.2–1.0)
pH, UA: 5.5 (ref 5.0–7.5)

## 2017-08-22 MED ORDER — RABEPRAZOLE SODIUM 20 MG PO TBEC: 20.0000 mg | DELAYED_RELEASE_TABLET | Freq: Two times a day (BID) | ORAL | 1 refills | Status: DC

## 2017-08-24 ENCOUNTER — Encounter: Payer: Self-pay | Admitting: Internal Medicine

## 2017-08-24 DIAGNOSIS — R928 Other abnormal and inconclusive findings on diagnostic imaging of breast: Secondary | ICD-10-CM | POA: Insufficient documentation

## 2017-08-24 NOTE — Assessment & Plan Note (Signed)
Follow vitamin D level.  

## 2017-08-24 NOTE — Assessment & Plan Note (Signed)
Persistent acid reflux.  On aciphex and zantac.  Given persistent symptoms despite medications, will refer to GI for evaluation and question of need for EGD.

## 2017-08-24 NOTE — Assessment & Plan Note (Signed)
Previous abnormal mammogram.  Saw Dr Bary Castilla. FNA 05/2016 - normal.  Overdue f/u mammogram.  Schedule.

## 2017-08-29 ENCOUNTER — Ambulatory Visit
Admission: RE | Admit: 2017-08-29 | Discharge: 2017-08-29 | Disposition: A | Discharge status: Home / Self Care | Payer: 59 | Source: Ambulatory Visit | Attending: Internal Medicine | Admitting: Internal Medicine

## 2017-08-29 ENCOUNTER — Encounter: Payer: Self-pay | Admitting: Internal Medicine

## 2017-08-29 DIAGNOSIS — N6001 Solitary cyst of right breast: Secondary | ICD-10-CM | POA: Diagnosis not present

## 2017-08-29 DIAGNOSIS — Z1239 Encounter for other screening for malignant neoplasm of breast: Secondary | ICD-10-CM

## 2017-08-29 DIAGNOSIS — R928 Other abnormal and inconclusive findings on diagnostic imaging of breast: Secondary | ICD-10-CM

## 2017-08-29 DIAGNOSIS — Z1231 Encounter for screening mammogram for malignant neoplasm of breast: Secondary | ICD-10-CM | POA: Insufficient documentation

## 2017-08-29 DIAGNOSIS — R922 Inconclusive mammogram: Secondary | ICD-10-CM | POA: Diagnosis not present

## 2017-09-15 ENCOUNTER — Encounter: Payer: Self-pay | Admitting: Internal Medicine

## 2017-09-16 DIAGNOSIS — K219 Gastro-esophageal reflux disease without esophagitis: Secondary | ICD-10-CM | POA: Diagnosis not present

## 2017-09-16 NOTE — Telephone Encounter (Signed)
Please call and notify pt that I reviewed her labs.  Her cholesterol and overall sugar control look good.  It does appear that she has immunity to measles, etc.  What does she need clarified regarding her prescriptions.  Per the previous note, she was informed prescriptions - done.  Please call pt and clarify what is needed.

## 2017-09-17 NOTE — Telephone Encounter (Signed)
LMTCB

## 2017-09-19 ENCOUNTER — Telehealth: Payer: Self-pay

## 2017-09-19 NOTE — Telephone Encounter (Signed)
Copied from [place] [phone] [id number]. Topic: General - Other >> Sep 19, 2017 10:11 AM Yvette Rack wrote: Reason for CRM: Pt returned call to [place]. Pt requests a call back at [phone]. Pt states if she does not answer please leave a detailed message.

## 2017-09-19 NOTE — Telephone Encounter (Signed)
Pt called to check status. Please advise.  °

## 2017-09-19 NOTE — Telephone Encounter (Signed)
Please contact patient

## 2017-09-19 NOTE — Telephone Encounter (Signed)
LMTCB

## 2017-09-19 NOTE — Telephone Encounter (Signed)
Work number = [phone]

## 2017-09-22 NOTE — Telephone Encounter (Signed)
Patient is aware of results and stated that her aciphex needs prior auth but Dr. Jerelene Redden gave her something different. I will call pharmacy to clarify

## 2017-09-22 NOTE — Telephone Encounter (Signed)
Patient returned call

## 2017-09-22 NOTE — Telephone Encounter (Signed)
Patient called back

## 2017-09-22 NOTE — Telephone Encounter (Signed)
Pt returned call to [place].  Pt requests that Larena Glassman please call her at [phone], instead of her cell phone during business hours.

## 2017-09-22 NOTE — Telephone Encounter (Signed)
Pt returned call to [place]. Pt states that she would prefer a message on mychart or a voicemail so she does not have to keep calling.

## 2017-09-23 NOTE — Telephone Encounter (Signed)
Patient aware of labs.  

## 2017-09-25 ENCOUNTER — Encounter: Payer: Self-pay | Admitting: Internal Medicine

## 2017-10-09 ENCOUNTER — Other Ambulatory Visit: Payer: Self-pay

## 2017-10-21 DIAGNOSIS — K222 Esophageal obstruction: Secondary | ICD-10-CM | POA: Diagnosis not present

## 2017-10-21 DIAGNOSIS — K449 Diaphragmatic hernia without obstruction or gangrene: Secondary | ICD-10-CM | POA: Diagnosis not present

## 2017-11-21 DIAGNOSIS — K222 Esophageal obstruction: Secondary | ICD-10-CM | POA: Insufficient documentation

## 2017-11-21 DIAGNOSIS — K449 Diaphragmatic hernia without obstruction or gangrene: Secondary | ICD-10-CM | POA: Diagnosis not present

## 2017-11-21 DIAGNOSIS — K219 Gastro-esophageal reflux disease without esophagitis: Secondary | ICD-10-CM | POA: Diagnosis not present

## 2017-12-04 ENCOUNTER — Encounter: Payer: Self-pay | Admitting: Internal Medicine

## 2017-12-04 ENCOUNTER — Ambulatory Visit: Payer: 59 | Admitting: Internal Medicine

## 2017-12-04 DIAGNOSIS — N926 Irregular menstruation, unspecified: Secondary | ICD-10-CM

## 2017-12-04 DIAGNOSIS — R928 Other abnormal and inconclusive findings on diagnostic imaging of breast: Secondary | ICD-10-CM | POA: Diagnosis not present

## 2017-12-04 DIAGNOSIS — E559 Vitamin D deficiency, unspecified: Secondary | ICD-10-CM

## 2017-12-04 DIAGNOSIS — K219 Gastro-esophageal reflux disease without esophagitis: Secondary | ICD-10-CM | POA: Diagnosis not present

## 2017-12-04 NOTE — Progress Notes (Signed)
Patient ID: Sabrina Schneider, female   DOB: 07-Jan-1966, 52 y.o.   MRN: [id number]   Subjective:    Patient ID: Sabrina Schneider, female    DOB: 1965/06/13, 52 y.o.   MRN: [id number]  HPI  Patient here for a scheduled follow up.  She just saw GI for f/u 11/21/17.  F/u for GERD, schatzki's ring and hiatal hernia.  On protonix.  Instructed to take pepcid in the pm for break through.  Recommended f/u in one year.  She reports she is doing well.  Feels good.  Stays active.  No chest pain.  No sob.  No abdominal pain.  Bowels moving.  States had a period 8-09/2016.  Period 05/2017 and period 07/2017.     Past Medical History:  Diagnosis Date  . Anemia   . BRCA negative   . Family history of breast cancer    daughter had breast cancer at age 49-BRCA positive. Patient is BRCA negative  . GERD (gastroesophageal reflux disease)   . Hiatal hernia    Past Surgical History:  Procedure Laterality Date  . BREAST CYST ASPIRATION  2010/ 2018   Right breast cyst aspiration  . CESAREAN SECTION  03/18/2001   fetal indications  . COLONOSCOPY N/A 06/01/2014   Procedure: COLONOSCOPY;  Surgeon: Robert Bellow, MD;  Location: [facility] ENDOSCOPY;  Service: Endoscopy;  Laterality: N/A;  . DILATION AND CURETTAGE OF UTERUS     tab  . UPPER GI ENDOSCOPY  2015   Dr. Vira Agar   Family History  Problem Relation Age of Onset  . Lung cancer Father 15  . Hypertension Mother   . Diabetes Mother   . Heart disease Mother   . Breast cancer Daughter 67       BCRA2 mutation   . Diabetes Sister   . Cervical cancer Sister   . Prostate cancer Maternal Grandfather 50  . Diabetes Sister   . Cervical cancer Sister 72   Social History   Socioeconomic History  . Marital status: Single    Spouse name: Not on file  . Number of children: 2  . Years of education: Not on file  . Highest education level: Associate degree: academic program  Occupational History  . Occupation: Patient Therapist, art Rep  Social Needs  . Financial  resource strain: Not on file  . Food insecurity:    Worry: Not on file    Inability: Not on file  . Transportation needs:    Medical: Not on file    Non-medical: Not on file  Tobacco Use  . Smoking status: Never Smoker  . Smokeless tobacco: Never Used  Substance and Sexual Activity  . Alcohol use: Yes    Alcohol/week: 0.0 standard drinks    Comment: occasionally  . Drug use: No  . Sexual activity: Yes    Partners: Male    Birth control/protection: None  Lifestyle  . Physical activity:    Days per week: 0 days    Minutes per session: 0 min  . Stress: Only a little  Relationships  . Social connections:    Talks on phone: Three times a week    Gets together: Once a week    Attends religious service: More than 4 times per year    Active member of club or organization: No    Attends meetings of clubs or organizations: Never    Relationship status: Never married  Other Topics Concern  . Not on file  Social History Narrative  .  Not on file    Outpatient Encounter Medications as of 12/04/2017  Medication Sig  . pantoprazole (PROTONIX) 40 MG tablet Take 40 mg by mouth daily.  . [DISCONTINUED] RABEprazole (ACIPHEX) 20 MG tablet Take 1 tablet (20 mg total) by mouth 2 (two) times daily before a meal.  . [DISCONTINUED] ranitidine (ZANTAC) 150 MG tablet TAKE 1 TABLET BY MOUTH AT BEDTIME  . [DISCONTINUED] triamcinolone cream (KENALOG) 0.1 % Apply 1 application topically 2 (two) times daily. (Patient not taking: Reported on 08/21/2017)   No facility-administered encounter medications on file as of 12/04/2017.     Review of Systems  Constitutional: Negative for appetite change and unexpected weight change.  HENT: Negative for congestion and sinus pressure.   Respiratory: Negative for cough, chest tightness and shortness of breath.   Cardiovascular: Negative for chest pain, palpitations and leg swelling.  Gastrointestinal: Negative for abdominal pain, diarrhea, nausea and vomiting.    Genitourinary: Negative for difficulty urinating and dysuria.  Musculoskeletal: Negative for joint swelling and myalgias.  Skin: Negative for color change and rash.  Neurological: Negative for dizziness, light-headedness and headaches.  Psychiatric/Behavioral: Negative for agitation and dysphoric mood.       Objective:    Physical Exam  Constitutional: She appears well-developed and well-nourished. No distress.  HENT:  Nose: Nose normal.  Mouth/Throat: Oropharynx is clear and moist.  Neck: Neck supple. No thyromegaly present.  Cardiovascular: Normal rate and regular rhythm.  Pulmonary/Chest: Breath sounds normal. No respiratory distress. She has no wheezes.  Abdominal: Soft. Bowel sounds are normal. There is no tenderness.  Musculoskeletal: She exhibits no edema or tenderness.  Lymphadenopathy:    She has no cervical adenopathy.  Skin: No rash noted. No erythema.  Psychiatric: She has a normal mood and affect. Her behavior is normal.    BP 130/76 (BP Location: Left Arm, Patient Position: Sitting, Cuff Size: Normal)   Pulse 76   Temp 98.1 F (36.7 C) (Oral)   Resp 18   Wt 161 lb 12.8 oz (73.4 kg)   SpO2 98%   BMI 28.66 kg/m  Wt Readings from Last 3 Encounters:  12/04/17 161 lb 12.8 oz (73.4 kg)  08/21/17 158 lb (71.7 kg)  01/09/17 164 lb (74.4 kg)     Lab Results  Component Value Date   WBC 4.6 02/06/2016   HGB 11.9 02/06/2016   HCT 35.6 02/06/2016   PLT 293 02/06/2016   GLUCOSE 92 02/23/2016   CHOL 144 08/21/2017   TRIG 74 08/21/2017   HDL 54 08/21/2017   LDLCALC 75 08/21/2017   ALT 13 02/06/2016   AST 15 02/06/2016   NA 139 02/23/2016   K 4.2 02/23/2016   CL 101 02/23/2016   CREATININE 1.0 08/21/2017   BUN 9 02/23/2016   CO2 24 02/23/2016   TSH 1.720 03/12/2016   HGBA1C 5.4 08/21/2017    US Breast Ltd Uni Right Inc Axilla  Result Date: 08/29/2017 CLINICAL DATA:  Follow-up right breast cysts. Patient had prior cyst aspiration of right breast. EXAM:  DIGITAL DIAGNOSTIC BILATERAL MAMMOGRAM WITH IMPLANTS, CAD AND TOMO ULTRASOUND RIGHT BREAST COMPARISON:  Previous exam(s). ACR Breast Density Category c: The breast tissue is heterogeneously dense, which may obscure small masses. FINDINGS: Cc and MLO views of bilateral breasts are submitted. No suspicious abnormalities identified bilaterally. Targeted ultrasound is performed, showing a few tiny cysts at the right breast 9 o'clock 2 cm from nipple largest measures 0.5 cm. There is a cluster of cysts at the right breast  9 o'clock 1 cm from nipple measuring 0.36 cm. Mammographic images were processed with CAD. IMPRESSION: Benign findings. RECOMMENDATION: Routine screening mammogram in 1 year. I have discussed the findings and recommendations with the patient. Results were also provided in writing at the conclusion of the visit. If applicable, a reminder letter will be sent to the patient regarding the next appointment. BI-RADS CATEGORY  2: Benign. Electronically Signed   By: Abelardo Diesel M.D.   On: 08/29/2017 11:15   Mm Diag Breast Tomo Bilateral  Result Date: 08/29/2017 CLINICAL DATA:  Follow-up right breast cysts. Patient had prior cyst aspiration of right breast. EXAM: DIGITAL DIAGNOSTIC BILATERAL MAMMOGRAM WITH IMPLANTS, CAD AND TOMO ULTRASOUND RIGHT BREAST COMPARISON:  Previous exam(s). ACR Breast Density Category c: The breast tissue is heterogeneously dense, which may obscure small masses. FINDINGS: Cc and MLO views of bilateral breasts are submitted. No suspicious abnormalities identified bilaterally. Targeted ultrasound is performed, showing a few tiny cysts at the right breast 9 o'clock 2 cm from nipple largest measures 0.5 cm. There is a cluster of cysts at the right breast 9 o'clock 1 cm from nipple measuring 0.36 cm. Mammographic images were processed with CAD. IMPRESSION: Benign findings. RECOMMENDATION: Routine screening mammogram in 1 year. I have discussed the findings and recommendations with the  patient. Results were also provided in writing at the conclusion of the visit. If applicable, a reminder letter will be sent to the patient regarding the next appointment. BI-RADS CATEGORY  2: Benign. Electronically Signed   By: Abelardo Diesel M.D.   On: 08/29/2017 11:15       Assessment & Plan:   Problem List Items Addressed This Visit    Abnormal mammogram    Previous abnormal mammogram.  Bilateral mammogram with right breast ultrasound 08/2017 - birads II.  Recommended f/u mammogram in one year.        GERD (gastroesophageal reflux disease)    Controlled on protonix.  Follow.        Relevant Medications   pantoprazole (PROTONIX) 40 MG tablet   Menstrual irregularity    Had period 8-09/2016, 05/2017 and 07/2017.  Discussed further evaluation.  Wants to monitor.  Will keep menstrual diary.        Vitamin D deficiency    Follow vitamin D level.            Einar Pheasant, MD

## 2017-12-07 ENCOUNTER — Encounter: Payer: Self-pay | Admitting: Internal Medicine

## 2017-12-07 DIAGNOSIS — N926 Irregular menstruation, unspecified: Secondary | ICD-10-CM | POA: Insufficient documentation

## 2017-12-07 NOTE — Assessment & Plan Note (Signed)
Follow vitamin D level.  

## 2017-12-07 NOTE — Assessment & Plan Note (Signed)
Previous abnormal mammogram.  Bilateral mammogram with right breast ultrasound 08/2017 - birads II.  Recommended f/u mammogram in one year.

## 2017-12-07 NOTE — Assessment & Plan Note (Signed)
Had period 8-09/2016, 05/2017 and 07/2017.  Discussed further evaluation.  Wants to monitor.  Will keep menstrual diary.

## 2017-12-07 NOTE — Assessment & Plan Note (Signed)
Controlled on protonix.  Follow.   

## 2018-05-13 ENCOUNTER — Other Ambulatory Visit: Payer: Self-pay

## 2018-05-13 ENCOUNTER — Ambulatory Visit (INDEPENDENT_AMBULATORY_CARE_PROVIDER_SITE_OTHER): Payer: 59 | Admitting: Family Medicine

## 2018-05-13 ENCOUNTER — Encounter: Payer: Self-pay | Admitting: Family Medicine

## 2018-05-13 DIAGNOSIS — K219 Gastro-esophageal reflux disease without esophagitis: Secondary | ICD-10-CM | POA: Diagnosis not present

## 2018-05-13 MED ORDER — ESOMEPRAZOLE MAGNESIUM 40 MG PO PACK: 40.0000 mg | PACK | Freq: Every day | ORAL | 0 refills | Status: DC

## 2018-05-13 NOTE — Progress Notes (Signed)
Patient ID: Sabrina Schneider, female   DOB: Feb 27, 1965, 53 y.o.   MRN: [id number]  Virtual Visit via video Note  This visit type was conducted due to national recommendations for restrictions regarding the COVID-19 pandemic (e.g. social distancing).  This format is felt to be most appropriate for this patient at this time.  All issues noted in this document were discussed and addressed.  No physical exam was performed (except for noted visual exam findings with Video Visits).   I connected with Sabrina Schneider on 05/13/18 at 11:20 AM EDT by a video enabled telemedicine application and verified that I am speaking with the correct person using two identifiers. Location patient: home Location provider: [place] Persons participating in the virtual visit: patient, provider  I discussed the limitations, risks, security and privacy concerns of performing an evaluation and management service by telephone and the availability of in person appointments. I also discussed with the patient that there may be a patient responsible charge related to this service. The patient expressed understanding and agreed to proceed.  HPI:  Patient and I connected via video due to complaints of increasing GERD and nausea.  Patient has trouble with GERD off and on for a long time.  Currently she is taking pantoprazole 40 mg daily prior to breakfast and using famotidine 40 mg at bedtime.  States she is still having breakthrough heartburn and feelings of nauseousness.  States the past 3 days she has eaten a very bland diet and mainly drinks chicken broth, water and Gatorade.  States she is fearful of eating any sort of food as it might trigger her GERD to be worse.  Denies vomiting or diarrhea.  Denies abdominal pain.  Denies cough, shortness of breath or wheezing.  Denies GU issues.  Denies fever or chills.   ROS: See pertinent positives and negatives per HPI.  Past Medical History:  Diagnosis Date  . Anemia   . BRCA  negative   . Family history of breast cancer    daughter had breast cancer at age 34-BRCA positive. Patient is BRCA negative  . GERD (gastroesophageal reflux disease)   . Hiatal hernia     Past Surgical History:  Procedure Laterality Date  . BREAST CYST ASPIRATION  2010/ 2018   Right breast cyst aspiration  . CESAREAN SECTION  03/18/2001   fetal indications  . COLONOSCOPY N/A 06/01/2014   Procedure: COLONOSCOPY;  Surgeon: Robert Bellow, MD;  Location: [facility] ENDOSCOPY;  Service: Endoscopy;  Laterality: N/A;  . DILATION AND CURETTAGE OF UTERUS     tab  . UPPER GI ENDOSCOPY  2015   Dr. Vira Agar    Family History  Problem Relation Age of Onset  . Lung cancer Father 40  . Hypertension Mother   . Diabetes Mother   . Heart disease Mother   . Breast cancer Daughter 66       BCRA2 mutation   . Diabetes Sister   . Cervical cancer Sister   . Prostate cancer Maternal Grandfather 87  . Diabetes Sister   . Cervical cancer Sister 46   Social History   Tobacco Use  . Smoking status: Never Smoker  . Smokeless tobacco: Never Used  Substance Use Topics  . Alcohol use: Yes    Alcohol/week: 0.0 Schneider drinks    Comment: occasionally    Current Outpatient Medications:  .  pantoprazole (PROTONIX) 40 MG tablet, Take 40 mg by mouth daily., Disp: , Rfl:  .  esomeprazole (NEXIUM) 40 MG  packet, Take 40 mg by mouth daily before breakfast., Disp: 30 each, Rfl: 0  EXAM:  GENERAL: alert, oriented, appears well and in no acute distress  HEENT: atraumatic, conjunttiva clear, no obvious abnormalities on inspection of external nose and ears  NECK: normal movements of the head and neck  LUNGS: on inspection no signs of respiratory distress, breathing rate appears normal, no obvious gross SOB, gasping or wheezing  CV: no obvious cyanosis  MS: moves all visible extremities without noticeable abnormality  PSYCH/NEURO: pleasant and cooperative, no obvious depression or anxiety, speech and  thought processing grossly intact  ASSESSMENT AND PLAN:  Discussed the following assessment and plan:  Gastroesophageal reflux disease, esophagitis presence not specified - Plan: esomeprazole (NEXIUM) 40 MG packet  We will trial changing her pantoprazole to as omeprazole daily to see if this change makes a difference and better control of her GERD symptoms.  She will continue Pepcid before bed and is been advised if needed on occasion she can take a Tums.  Advised if she starts noticing herself taking Tums more often than once or twice a week to call and let us know.  Also advised to eat a bland diet and clear liquids for the next 1 to 2 days and slowly advance diet as tolerated.  Also advised to avoid GERD triggers like fried fatty and spicy foods, caffeine, chocolate, carbonation and beverages, peppermint and to avoid NSAIDs.  Discussed chewing well and eating slowly, taking small bites and drinking beverages at room temperature to help avoid other triggers that can potentially worsen GERD symptoms.  Discussed possibility that if her GERD is not helped with these changes that we may have to refer her back to GI for further evaluation and management.   I discussed the assessment and treatment plan with the patient. The patient was provided an opportunity to ask questions and all were answered. The patient agreed with the plan and demonstrated an understanding of the instructions.   The patient was advised to call back or seek an in-person evaluation if the symptoms worsen or if the condition fails to improve as anticipated.  Jodelle Green, FNP

## 2018-06-18 ENCOUNTER — Telehealth: Payer: Self-pay | Admitting: Internal Medicine

## 2018-06-18 ENCOUNTER — Other Ambulatory Visit: Payer: Self-pay | Admitting: Lab

## 2018-06-18 NOTE — Telephone Encounter (Signed)
Copied from [place] [id number]. Topic: Quick Communication - See Telephone Encounter >> Jun 18, 2018  3:40 PM Loma [place] wrote: CRM for notification. See Telephone encounter for: 06/18/18.esomeprazole (NEXIUM) 40 MG  liquid   [facility] [place], [place] [employer] [phone] (Phone) [phone] (Fax) Pt says that the 30 day supply was during great until she ran out so she would like a normal script and says that [facility] wants scripts in 90 day format. [facility] states that they have sent a form to [place] in order to follow thru on this script. They are waiting for incoming information. Pt may be contacted at [phone] for further questions.

## 2018-06-19 NOTE — Telephone Encounter (Signed)
Sabrina Schneider started pt on nexium. Pt stated is working well and would like to continue. Ok to continue filling by you?

## 2018-06-19 NOTE — Telephone Encounter (Signed)
Ok to refill nexium x 2.   Keep physical with me in August.  Also, given the issues she has had with her upper GI symptoms and the need for nexium and pepcid, she needs GI evaluation for possible endoscopy at some point.  Even if better controlled now.  Let me know if agreeable and I will place the order for the referral.

## 2018-06-22 ENCOUNTER — Other Ambulatory Visit: Payer: Self-pay

## 2018-06-22 DIAGNOSIS — K219 Gastro-esophageal reflux disease without esophagitis: Secondary | ICD-10-CM

## 2018-06-22 MED ORDER — ESOMEPRAZOLE MAGNESIUM 40 MG PO PACK: 40.0000 mg | PACK | Freq: Every day | ORAL | 0 refills | Status: DC

## 2018-06-22 NOTE — Telephone Encounter (Signed)
rx refilled. LMTCB

## 2018-08-10 ENCOUNTER — Other Ambulatory Visit: Payer: Self-pay | Admitting: Internal Medicine

## 2018-08-25 ENCOUNTER — Other Ambulatory Visit: Payer: Self-pay

## 2018-08-27 ENCOUNTER — Encounter: Payer: Self-pay | Admitting: Internal Medicine

## 2018-08-27 ENCOUNTER — Telehealth: Payer: Self-pay | Admitting: Internal Medicine

## 2018-08-27 ENCOUNTER — Other Ambulatory Visit: Payer: Self-pay

## 2018-08-27 ENCOUNTER — Ambulatory Visit (INDEPENDENT_AMBULATORY_CARE_PROVIDER_SITE_OTHER): Payer: 59 | Admitting: Internal Medicine

## 2018-08-27 VITALS — BP 128/70 | HR 72 | Temp 97.5°F | Resp 16 | Ht 62.99 in | Wt 160.0 lb

## 2018-08-27 DIAGNOSIS — M79602 Pain in left arm: Secondary | ICD-10-CM | POA: Diagnosis not present

## 2018-08-27 DIAGNOSIS — K219 Gastro-esophageal reflux disease without esophagitis: Secondary | ICD-10-CM

## 2018-08-27 DIAGNOSIS — Z1239 Encounter for other screening for malignant neoplasm of breast: Secondary | ICD-10-CM | POA: Diagnosis not present

## 2018-08-27 DIAGNOSIS — Z0000 Encounter for general adult medical examination without abnormal findings: Secondary | ICD-10-CM

## 2018-08-27 DIAGNOSIS — R232 Flushing: Secondary | ICD-10-CM

## 2018-08-27 DIAGNOSIS — M79671 Pain in right foot: Secondary | ICD-10-CM

## 2018-08-27 DIAGNOSIS — Z13220 Encounter for screening for lipoid disorders: Secondary | ICD-10-CM | POA: Diagnosis not present

## 2018-08-27 DIAGNOSIS — Z0001 Encounter for general adult medical examination with abnormal findings: Secondary | ICD-10-CM | POA: Diagnosis not present

## 2018-08-27 DIAGNOSIS — M79672 Pain in left foot: Secondary | ICD-10-CM

## 2018-08-27 DIAGNOSIS — E559 Vitamin D deficiency, unspecified: Secondary | ICD-10-CM | POA: Diagnosis not present

## 2018-08-27 NOTE — Telephone Encounter (Signed)
Pt would ike to do fasting labs. Order is needed please and Thank you. Pt works for [employer]. Please advise? Thank you!

## 2018-08-27 NOTE — Telephone Encounter (Signed)
Orders placed for [employer] labs.  Notify pt she can go by [employer] draw station at her convenience.

## 2018-08-27 NOTE — Progress Notes (Signed)
Patient ID: Sabrina Schneider, female   DOB: 08/16/1965, 53 y.o.   MRN: [id number]   Subjective:    Patient ID: Sabrina Schneider, female    DOB: 1965-08-28, 53 y.o.   MRN: [id number]  HPI  Patient here for her physical exam.  She reports she is doing relatively well.  Saw Lauren 04/2018 for reflux. Note reviewed.  She is currently taking nexium.  Doing well on this medication.  No acid reflux.  No problems swallowing.  Trying to stay active.  No chest pain.  No sob.  No acid reflux.  No abdominal pain.  Bowels moving.  Previously saw GI 11/2017.  EGD - GERD/schatzki ring/ hiatal hernia.  Does report some left arm pain.  Some discomfort with certain position changes.  Wants to hold on any further evaluation.  Does report bilateral foot pain.  Worse when first steps out of bed in am. Once starts moving.- better.  Discussed possible etiologies (plantar fasciitis, etc).  Reports hot flashes.  Discussed treatment options.  Daughter with breast cancer.     Past Medical History:  Diagnosis Date   Anemia    BRCA negative    Family history of breast cancer    daughter had breast cancer at age 24-BRCA positive. Patient is BRCA negative   GERD (gastroesophageal reflux disease)    Hiatal hernia    Past Surgical History:  Procedure Laterality Date   BREAST CYST ASPIRATION  2010/ 2018   Right breast cyst aspiration   CESAREAN SECTION  03/18/2001   fetal indications   COLONOSCOPY N/A 06/01/2014   Procedure: COLONOSCOPY;  Surgeon: Robert Bellow, MD;  Location: [facility] ENDOSCOPY;  Service: Endoscopy;  Laterality: N/A;   DILATION AND CURETTAGE OF UTERUS     tab   UPPER GI ENDOSCOPY  2015   Dr. Vira Agar   Family History  Problem Relation Age of Onset   Lung cancer Father 80   Hypertension Mother    Diabetes Mother    Heart disease Mother    Breast cancer Daughter 35       BCRA2 mutation    Diabetes Sister    Cervical cancer Sister    Prostate cancer Maternal Grandfather 103   Diabetes  Sister    Cervical cancer Sister 69   Social History   Socioeconomic History   Marital status: Single    Spouse name: Not on file   Number of children: 2   Years of education: Not on file   Highest education level: Associate degree: academic program  Occupational History   Occupation: Patient Therapist, art Rep  Social Needs   Financial resource strain: Not on file   Food insecurity    Worry: Not on file    Inability: Not on file   Transportation needs    Medical: Not on file    Non-medical: Not on file  Tobacco Use   Smoking status: Never Smoker   Smokeless tobacco: Never Used  Substance and Sexual Activity   Alcohol use: Yes    Alcohol/week: 0.0 standard drinks    Comment: occasionally   Drug use: No   Sexual activity: Yes    Partners: Male    Birth control/protection: None  Lifestyle   Physical activity    Days per week: 0 days    Minutes per session: 0 min   Stress: Only a little  Relationships   Social connections    Talks on phone: Three times a week    Gets together:  Once a week    Attends religious service: More than 4 times per year    Active member of club or organization: No    Attends meetings of clubs or organizations: Never    Relationship status: Never married  Other Topics Concern   Not on file  Social History Narrative   Not on file    Outpatient Encounter Medications as of 08/27/2018  Medication Sig   esomeprazole (NEXIUM) 40 MG packet MIX CONTENTS OF 1 PACKET  WITH TABLESPOON WATER WAIT  2 TO 3 MINUTES THEN DRINK  BY MOUTH DAILY BEFORE  BREAKFAST   [DISCONTINUED] pantoprazole (PROTONIX) 40 MG tablet Take 40 mg by mouth daily.   No facility-administered encounter medications on file as of 08/27/2018.     Review of Systems  Constitutional: Negative for appetite change and unexpected weight change.  HENT: Negative for congestion and sinus pressure.   Eyes: Negative for pain and visual disturbance.  Respiratory:  Negative for cough, chest tightness and shortness of breath.   Cardiovascular: Negative for chest pain, palpitations and leg swelling.  Gastrointestinal: Negative for abdominal pain, diarrhea, nausea and vomiting.  Genitourinary: Negative for difficulty urinating and dysuria.  Musculoskeletal: Negative for joint swelling and myalgias.  Skin: Negative for color change and rash.  Neurological: Negative for dizziness, light-headedness and headaches.  Hematological: Negative for adenopathy. Does not bruise/bleed easily.  Psychiatric/Behavioral: Negative for agitation and dysphoric mood.       Objective:    Physical Exam Constitutional:      General: She is not in acute distress.    Appearance: Normal appearance. She is well-developed.  HENT:     Right Ear: External ear normal.     Left Ear: External ear normal.  Eyes:     General: No scleral icterus.       Right eye: No discharge.        Left eye: No discharge.     Conjunctiva/sclera: Conjunctivae normal.  Neck:     Musculoskeletal: Neck supple. No muscular tenderness.     Thyroid: No thyromegaly.  Cardiovascular:     Rate and Rhythm: Normal rate and regular rhythm.  Pulmonary:     Effort: No tachypnea, accessory muscle usage or respiratory distress.     Breath sounds: Normal breath sounds. No decreased breath sounds or wheezing.  Chest:     Breasts:        Right: No inverted nipple, mass, nipple discharge or tenderness (no axillary adenopathy).        Left: No inverted nipple, mass, nipple discharge or tenderness (no axilarry adenopathy).  Abdominal:     General: Bowel sounds are normal.     Palpations: Abdomen is soft.     Tenderness: There is no abdominal tenderness.  Genitourinary:    Comments: Normal external genitalia.  Vaginal vault without lesions.  Cervix identified.  Pap smear performed.  Could not appreciate any adnexal masses or tenderness.   Musculoskeletal:        General: No swelling or tenderness.    Lymphadenopathy:     Cervical: No cervical adenopathy.  Skin:    Findings: No erythema or rash.  Neurological:     Mental Status: She is alert and oriented to person, place, and time.  Psychiatric:        Mood and Affect: Mood normal.        Behavior: Behavior normal.     BP 128/70    Pulse 72    Temp (!) 97.5 F (  36.4 C) (Temporal)    Resp 16    Wt 160 lb (72.6 kg)    SpO2 98%    BMI 28.34 kg/m  Wt Readings from Last 3 Encounters:  08/27/18 160 lb (72.6 kg)  12/04/17 161 lb 12.8 oz (73.4 kg)  08/21/17 158 lb (71.7 kg)     Lab Results  Component Value Date   WBC 4.6 02/06/2016   HGB 11.9 02/06/2016   HCT 35.6 02/06/2016   PLT 293 02/06/2016   GLUCOSE 92 02/23/2016   CHOL 144 08/21/2017   TRIG 74 08/21/2017   HDL 54 08/21/2017   LDLCALC 75 08/21/2017   ALT 13 02/06/2016   AST 15 02/06/2016   NA 139 02/23/2016   K 4.2 02/23/2016   CL 101 02/23/2016   CREATININE 1.0 08/21/2017   BUN 9 02/23/2016   CO2 24 02/23/2016   TSH 1.720 03/12/2016   HGBA1C 5.4 08/21/2017    US Breast Ltd Uni Right Inc Axilla  Result Date: 08/29/2017 CLINICAL DATA:  Follow-up right breast cysts. Patient had prior cyst aspiration of right breast. EXAM: DIGITAL DIAGNOSTIC BILATERAL MAMMOGRAM WITH IMPLANTS, CAD AND TOMO ULTRASOUND RIGHT BREAST COMPARISON:  Previous exam(s). ACR Breast Density Category c: The breast tissue is heterogeneously dense, which may obscure small masses. FINDINGS: Cc and MLO views of bilateral breasts are submitted. No suspicious abnormalities identified bilaterally. Targeted ultrasound is performed, showing a few tiny cysts at the right breast 9 o'clock 2 cm from nipple largest measures 0.5 cm. There is a cluster of cysts at the right breast 9 o'clock 1 cm from nipple measuring 0.36 cm. Mammographic images were processed with CAD. IMPRESSION: Benign findings. RECOMMENDATION: Routine screening mammogram in 1 year. I have discussed the findings and recommendations with the  patient. Results were also provided in writing at the conclusion of the visit. If applicable, a reminder letter will be sent to the patient regarding the next appointment. BI-RADS CATEGORY  2: Benign. Electronically Signed   By: Abelardo Diesel M.D.   On: 08/29/2017 11:15   Mm Diag Breast Tomo Bilateral  Result Date: 08/29/2017 CLINICAL DATA:  Follow-up right breast cysts. Patient had prior cyst aspiration of right breast. EXAM: DIGITAL DIAGNOSTIC BILATERAL MAMMOGRAM WITH IMPLANTS, CAD AND TOMO ULTRASOUND RIGHT BREAST COMPARISON:  Previous exam(s). ACR Breast Density Category c: The breast tissue is heterogeneously dense, which may obscure small masses. FINDINGS: Cc and MLO views of bilateral breasts are submitted. No suspicious abnormalities identified bilaterally. Targeted ultrasound is performed, showing a few tiny cysts at the right breast 9 o'clock 2 cm from nipple largest measures 0.5 cm. There is a cluster of cysts at the right breast 9 o'clock 1 cm from nipple measuring 0.36 cm. Mammographic images were processed with CAD. IMPRESSION: Benign findings. RECOMMENDATION: Routine screening mammogram in 1 year. I have discussed the findings and recommendations with the patient. Results were also provided in writing at the conclusion of the visit. If applicable, a reminder letter will be sent to the patient regarding the next appointment. BI-RADS CATEGORY  2: Benign. Electronically Signed   By: Abelardo Diesel M.D.   On: 08/29/2017 11:15       Assessment & Plan:   Problem List Items Addressed This Visit    None       Einar Pheasant, MD

## 2018-08-28 NOTE — Telephone Encounter (Signed)
Left detailed message for patient.

## 2018-08-30 ENCOUNTER — Encounter: Payer: Self-pay | Admitting: Internal Medicine

## 2018-08-30 DIAGNOSIS — M79602 Pain in left arm: Secondary | ICD-10-CM | POA: Insufficient documentation

## 2018-08-30 DIAGNOSIS — R232 Flushing: Secondary | ICD-10-CM | POA: Insufficient documentation

## 2018-08-30 DIAGNOSIS — M79673 Pain in unspecified foot: Secondary | ICD-10-CM | POA: Insufficient documentation

## 2018-08-30 NOTE — Assessment & Plan Note (Signed)
Follow vitamin d level.   

## 2018-08-30 NOTE — Assessment & Plan Note (Signed)
Controlled on nexium.  Follow.  

## 2018-08-30 NOTE — Assessment & Plan Note (Signed)
Physical today 08/27/18.  Saw Dr Bary Castilla 05/14/16 - FNA - benign.  Mammogram 08/29/17 - Birads II.  Needs mammogram.  Schedule.  Colonoscopy 2016.

## 2018-08-30 NOTE — Assessment & Plan Note (Signed)
Hot flashes as outlined.  Discussed treatment options.  Discussed otc options.  Discussed estrogen - risks and possible side effects.  Discussed effexor.  She wants to hold on further treatment at this time.  Is manageable.  Follow.

## 2018-08-30 NOTE — Assessment & Plan Note (Signed)
Positional.  Desires no further w/up or intervention.

## 2018-08-30 NOTE — Assessment & Plan Note (Signed)
Worse when first gets up.  Once moving, better.  Question plantar fasciitis.  Discussed stretches, supports, conservative treatment.  Follow.  Notify me if persistent.

## 2018-09-09 ENCOUNTER — Encounter: Payer: Self-pay | Admitting: Internal Medicine

## 2018-09-12 LAB — COMPREHENSIVE METABOLIC PANEL
ALT: 21 IU/L (ref 0–32)
AST: 19 IU/L (ref 0–40)
Albumin/Globulin Ratio: 1.3 (ref 1.2–2.2)
Albumin: 4 g/dL (ref 3.8–4.9)
Alkaline Phosphatase: 99 IU/L (ref 39–117)
BUN/Creatinine Ratio: 13 (ref 9–23)
BUN: 12 mg/dL (ref 6–24)
Bilirubin Total: 0.4 mg/dL (ref 0.0–1.2)
CO2: 25 mmol/L (ref 20–29)
Calcium: 9.4 mg/dL (ref 8.7–10.2)
Chloride: 104 mmol/L (ref 96–106)
Creatinine, Ser: 0.92 mg/dL (ref 0.57–1.00)
GFR calc Af Amer: 81 mL/min/{1.73_m2} (ref >59)
GFR calc non Af Amer: 70 mL/min/{1.73_m2} (ref >59)
Globulin, Total: 3 g/dL (ref 1.5–4.5)
Glucose: 92 mg/dL (ref 65–99)
Potassium: 4.6 mmol/L (ref 3.5–5.2)
Sodium: 139 mmol/L (ref 134–144)
Total Protein: 7 g/dL (ref 6.0–8.5)

## 2018-09-12 LAB — CBC WITH DIFFERENTIAL/PLATELET
Basophils Absolute: 0 10*3/uL (ref 0.0–0.2)
Basos: 1 %
EOS (ABSOLUTE): 0.1 10*3/uL (ref 0.0–0.4)
Eos: 2 %
Hematocrit: 36.1 % (ref 34.0–46.6)
Hemoglobin: 11.9 g/dL (ref 11.1–15.9)
Immature Grans (Abs): 0 10*3/uL (ref 0.0–0.1)
Immature Granulocytes: 0 %
Lymphocytes Absolute: 1.7 10*3/uL (ref 0.7–3.1)
Lymphs: 42 %
MCH: 29.6 pg (ref 26.6–33.0)
MCHC: 33 g/dL (ref 31.5–35.7)
MCV: 90 fL (ref 79–97)
Monocytes Absolute: 0.4 10*3/uL (ref 0.1–0.9)
Monocytes: 9 %
Neutrophils Absolute: 1.9 10*3/uL (ref 1.4–7.0)
Neutrophils: 46 %
Platelets: 261 10*3/uL (ref 150–450)
RBC: 4.02 x10E6/uL (ref 3.77–5.28)
RDW: 12.5 % (ref 11.7–15.4)
WBC: 4.1 10*3/uL (ref 3.4–10.8)

## 2018-09-12 LAB — LIPID PANEL
Chol/HDL Ratio: 2.7 ratio (ref 0.0–4.4)
Cholesterol, Total: 143 mg/dL (ref 100–199)
HDL: 53 mg/dL (ref >39)
LDL Calculated: 69 mg/dL (ref 0–99)
Triglycerides: 105 mg/dL (ref 0–149)
VLDL Cholesterol Cal: 21 mg/dL (ref 5–40)

## 2018-09-12 LAB — TSH: TSH: 1.4 u[IU]/mL (ref 0.450–4.500)

## 2018-09-14 ENCOUNTER — Encounter: Payer: Self-pay | Admitting: Internal Medicine

## 2018-09-24 ENCOUNTER — Encounter: Payer: Self-pay | Admitting: Internal Medicine

## 2018-10-17 IMAGING — DX DG LUMBAR SPINE 2-3V
3 series · 3 of 3 positions shown · non-contrast
Comparison: 03/18/2001 rib.

CLINICAL DATA: Back pain.

EXAM:
LUMBAR SPINE - 2-3 VIEW

[lumbar spine ap]
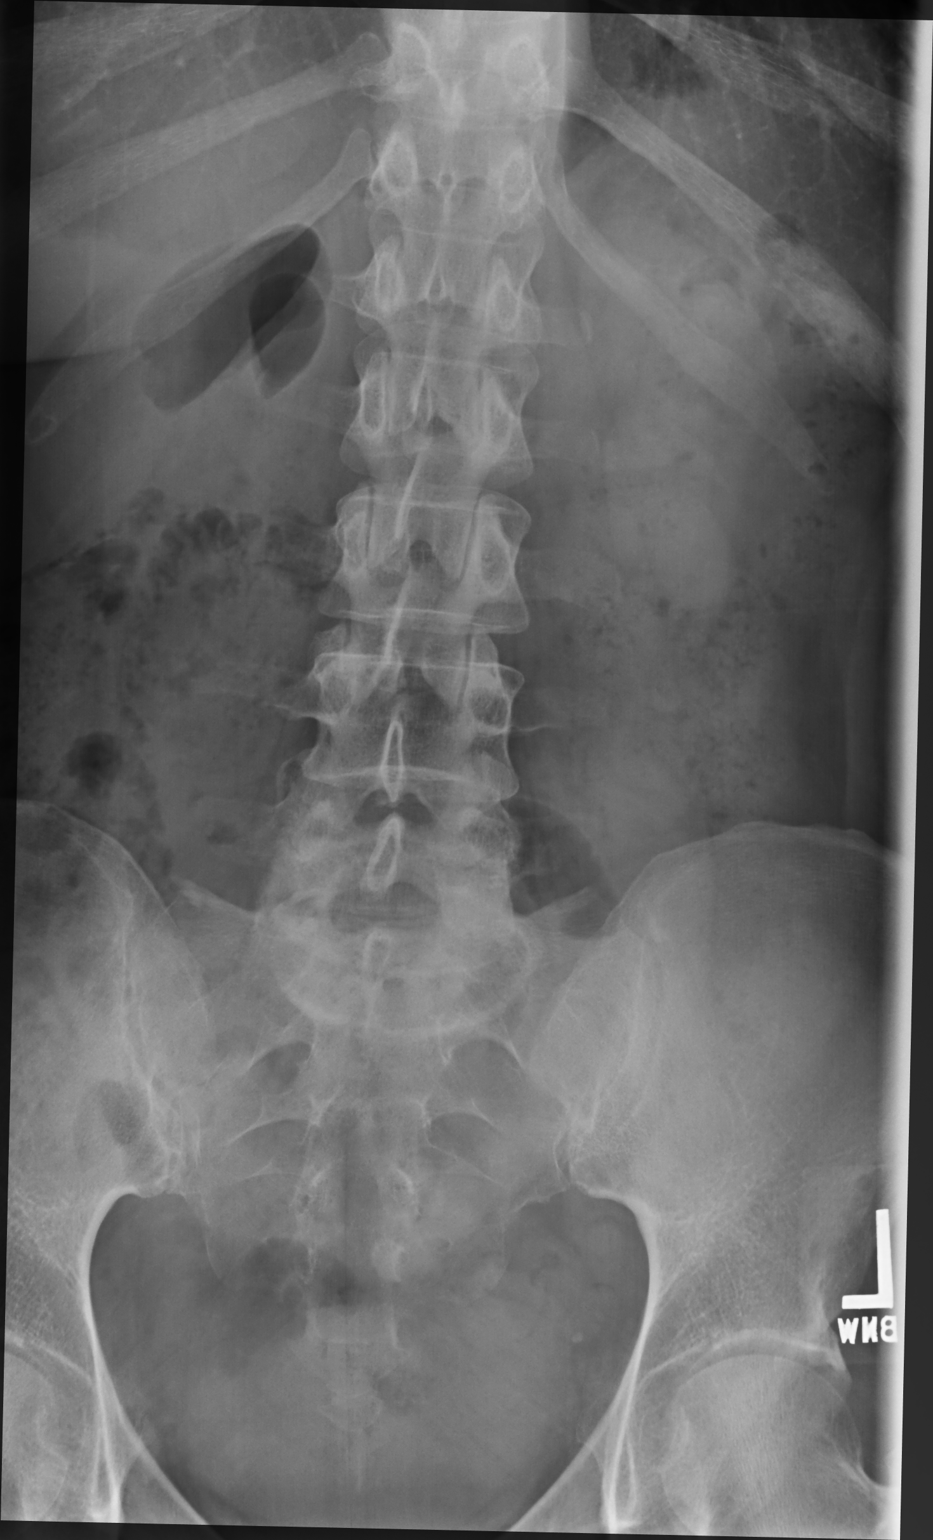

[lumbar spine lat (1 of 2)]
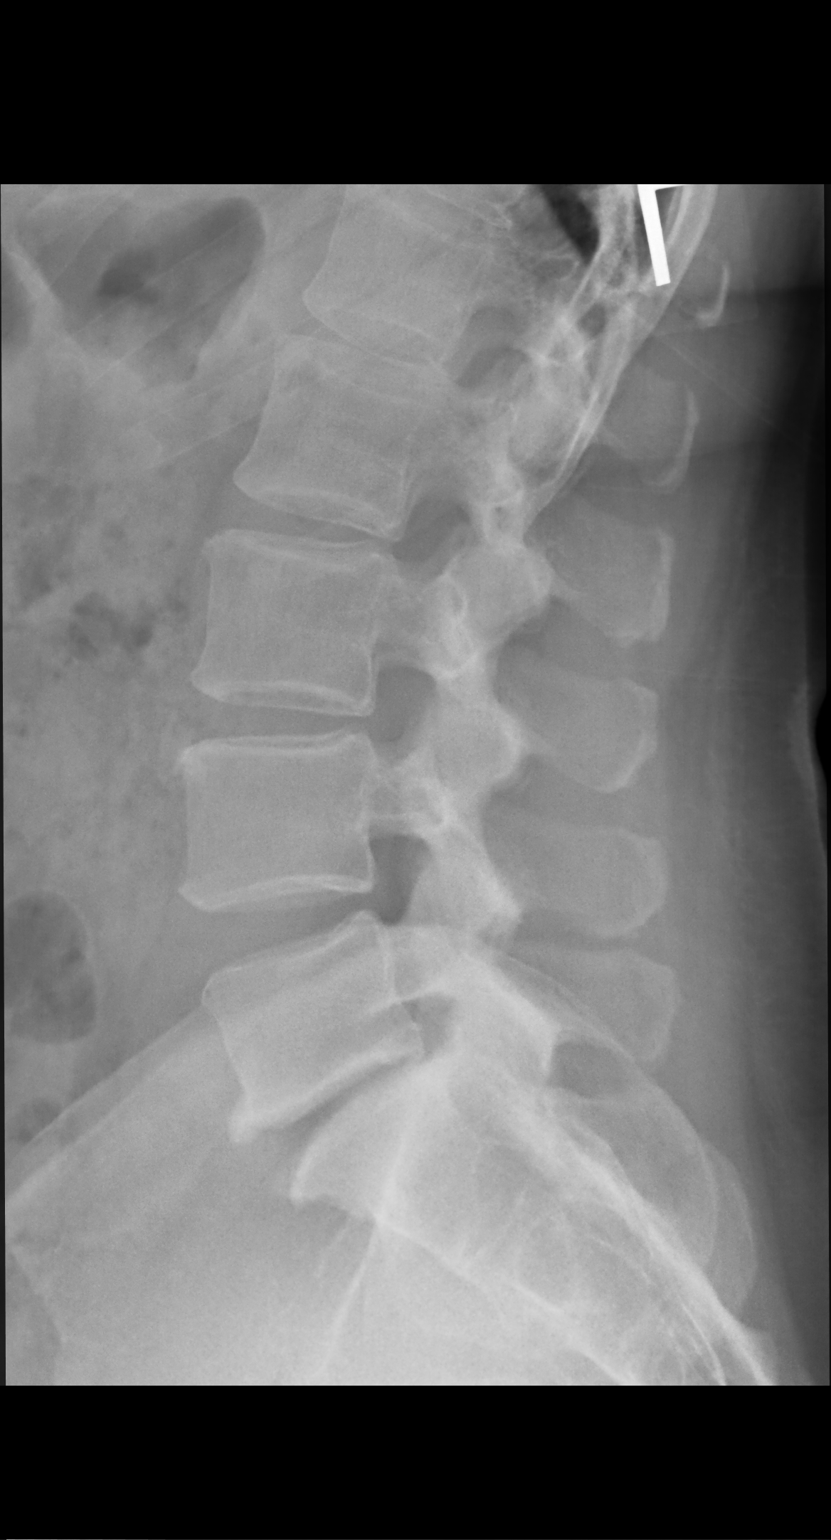

[lumbar spine lat (2 of 2)]
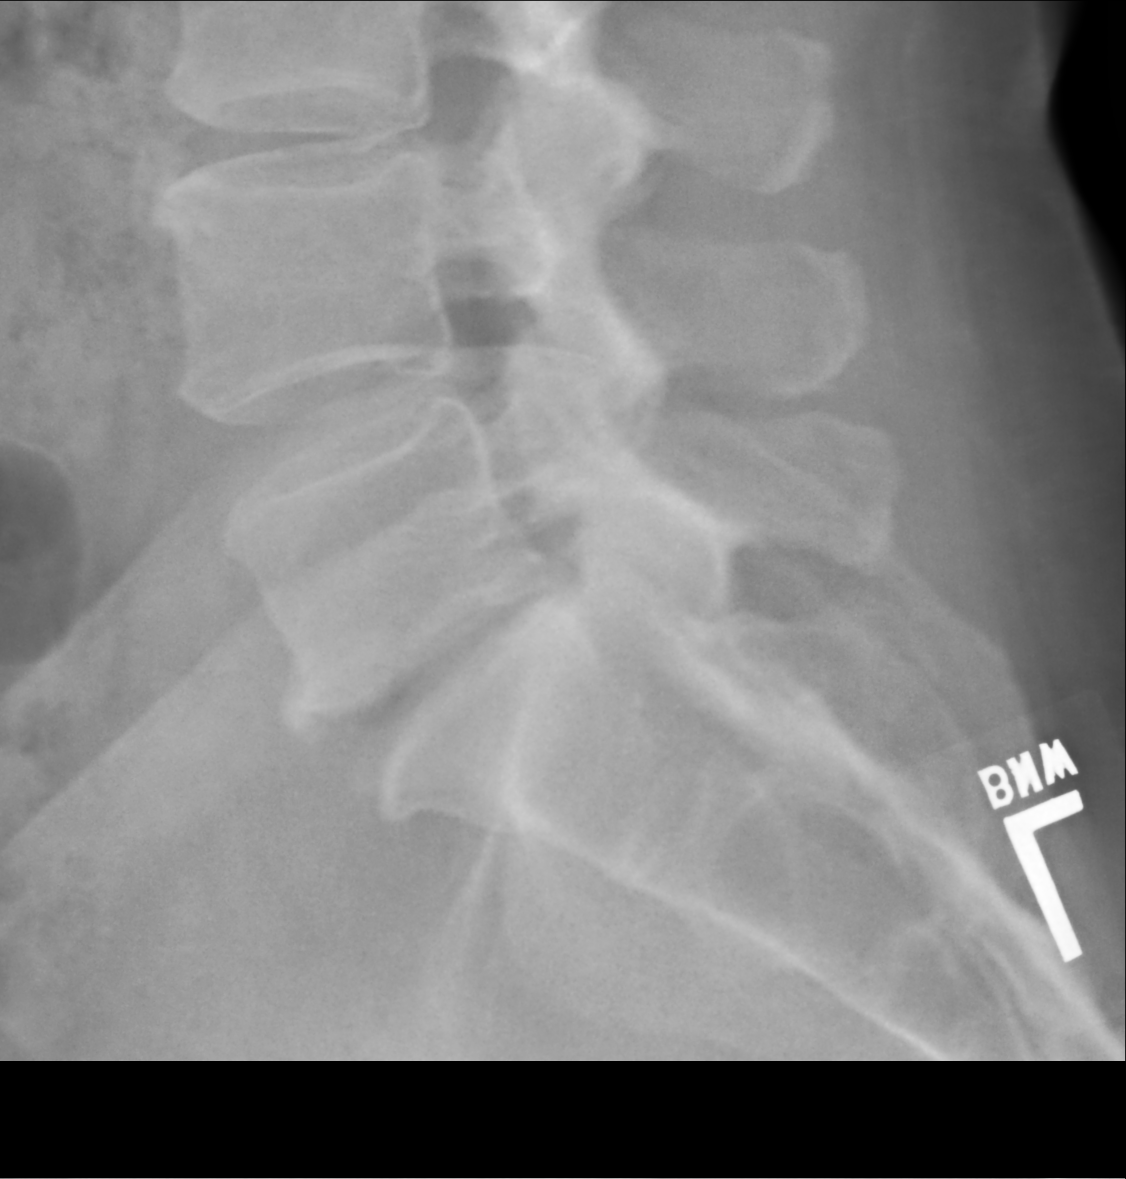

[3 of 3 positions shown; findings below may reference images not displayed]

FINDINGS: No acute bony abnormality identified. Diffuse mild degenerative
change. Normal alignment mineralization. Pelvic calcifications
consistent phleboliths.
IMPRESSION: Diffuse degenerative change.  No acute abnormality identified.

## 2019-02-05 ENCOUNTER — Ambulatory Visit
Admission: RE | Admit: 2019-02-05 | Discharge: 2019-02-05 | Disposition: A | Discharge status: Home / Self Care | Payer: 59 | Source: Ambulatory Visit | Attending: Internal Medicine | Admitting: Internal Medicine

## 2019-02-05 ENCOUNTER — Encounter: Payer: Self-pay | Admitting: Radiology

## 2019-02-05 DIAGNOSIS — Z1231 Encounter for screening mammogram for malignant neoplasm of breast: Secondary | ICD-10-CM | POA: Insufficient documentation

## 2019-02-05 DIAGNOSIS — Z1239 Encounter for other screening for malignant neoplasm of breast: Secondary | ICD-10-CM

## 2019-02-24 ENCOUNTER — Encounter: Payer: Self-pay | Admitting: Internal Medicine

## 2019-03-01 ENCOUNTER — Ambulatory Visit: Payer: 59 | Admitting: Internal Medicine

## 2019-03-01 ENCOUNTER — Other Ambulatory Visit: Payer: Self-pay

## 2019-03-01 VITALS — BP 116/78 | HR 90 | Temp 97.2°F | Resp 16 | Ht 62.9 in | Wt 160.0 lb

## 2019-03-01 DIAGNOSIS — M79602 Pain in left arm: Secondary | ICD-10-CM

## 2019-03-01 DIAGNOSIS — M79672 Pain in left foot: Secondary | ICD-10-CM | POA: Diagnosis not present

## 2019-03-01 DIAGNOSIS — E559 Vitamin D deficiency, unspecified: Secondary | ICD-10-CM

## 2019-03-01 DIAGNOSIS — R21 Rash and other nonspecific skin eruption: Secondary | ICD-10-CM

## 2019-03-01 DIAGNOSIS — R300 Dysuria: Secondary | ICD-10-CM

## 2019-03-01 DIAGNOSIS — Z13220 Encounter for screening for lipoid disorders: Secondary | ICD-10-CM

## 2019-03-01 DIAGNOSIS — K219 Gastro-esophageal reflux disease without esophagitis: Secondary | ICD-10-CM

## 2019-03-01 MED ORDER — TRIAMCINOLONE ACETONIDE 0.1 % EX CREA: 1.0000 "application " | TOPICAL_CREAM | Freq: Two times a day (BID) | CUTANEOUS | 0 refills | Status: DC

## 2019-03-01 NOTE — Progress Notes (Addendum)
Patient ID: Sabrina Schneider, female   DOB: 1965/08/25, 54 y.o.   MRN: [id number]   Subjective:    Patient ID: Sabrina Schneider, female    DOB: [birth year], 54 y.o.   MRN: [id number]  HPI This visit occurred during the SARS-CoV-2 public health emergency.  Safety protocols were in place, including screening questions prior to the visit, additional usage of staff PPE, and extensive cleaning of exam room while observing appropriate contact time as indicated for disinfecting solutions.  Patient here for a scheduled follow up.  She reports she is doing relatively well.  Few concerns today.  Still having persistent pain - left arm.  Positional.  No chest pain or sob.  No acid reflux.  No abdominal pain.  Does report rash - low back.  Some itching.  Also reports feels like she is walking on bubbles.  Sensation change - feet.  Discussed possible neuropathy.  No falls.  No unsteadiness.  Does report some discomfort with urination.  No hematuria.  No pressure.  No vaginal symptoms.  Just had mammogram.     Past Medical History:  Diagnosis Date  . Anemia   . BRCA negative   . Family history of breast cancer    daughter had breast cancer at age 48-BRCA positive. Patient is BRCA negative  . GERD (gastroesophageal reflux disease)   . Hiatal hernia    Past Surgical History:  Procedure Laterality Date  . BREAST CYST ASPIRATION  2010/ 2018   Right breast cyst aspiration  . CESAREAN SECTION  03/18/2001   fetal indications  . COLONOSCOPY N/A 06/01/2014   Procedure: COLONOSCOPY;  Surgeon: Robert Bellow, MD;  Location: [facility] ENDOSCOPY;  Service: Endoscopy;  Laterality: N/A;  . DILATION AND CURETTAGE OF UTERUS     tab  . UPPER GI ENDOSCOPY  2015   Dr. Vira Agar   Family History  Problem Relation Age of Onset  . Lung cancer Father 28  . Hypertension Mother   . Diabetes Mother   . Heart disease Mother   . Breast cancer Daughter 57       BCRA2 mutation   . Diabetes Sister   . Cervical cancer Sister   .  Prostate cancer Maternal Grandfather 66  . Diabetes Sister   . Cervical cancer Sister 8   Social History   Socioeconomic History  . Marital status: Single    Spouse name: Not on file  . Number of children: 2  . Years of education: Not on file  . Highest education level: Associate degree: academic program  Occupational History  . Occupation: [name]  Tobacco Use  . Smoking status: Never Smoker  . Smokeless tobacco: Never Used  Substance and Sexual Activity  . Alcohol use: Yes    Alcohol/week: 0.0 standard drinks    Comment: occasionally  . Drug use: No  . Sexual activity: Yes    Partners: Male    Birth control/protection: None  Other Topics Concern  . Not on file  Social History Narrative  . Not on file   Social Determinants of Health   Financial Resource Strain:   . Difficulty of Paying Living Expenses: Not on file  Food Insecurity:   . Worried About Charity fundraiser in the Last Year: Not on file  . Ran Out of Food in the Last Year: Not on file  Transportation Needs:   . Lack of Transportation (Medical): Not on file  . Lack of Transportation (Non-Medical): Not on  file  Physical Activity:   . Days of Exercise per Week: Not on file  . Minutes of Exercise per Session: Not on file  Stress:   . Feeling of Stress : Not on file  Social Connections:   . Frequency of Communication with Friends and Family: Not on file  . Frequency of Social Gatherings with Friends and Family: Not on file  . Attends Religious Services: Not on file  . Active Member of Clubs or Organizations: Not on file  . Attends Archivist Meetings: Not on file  . Marital Status: Not on file    Outpatient Encounter Medications as of 03/01/2019  Medication Sig  . esomeprazole (NEXIUM) 40 MG packet MIX CONTENTS OF 1 PACKET  WITH TABLESPOON WATER WAIT  2 TO 3 MINUTES THEN DRINK  BY MOUTH DAILY BEFORE  BREAKFAST  . triamcinolone cream (KENALOG) 0.1 % Apply 1 application  topically 2 (two) times daily.   No facility-administered encounter medications on file as of 03/01/2019.    Review of Systems  Constitutional: Negative for appetite change and unexpected weight change.  HENT: Negative for congestion and sinus pressure.   Respiratory: Negative for cough, chest tightness and shortness of breath.   Cardiovascular: Negative for chest pain, palpitations and leg swelling.  Gastrointestinal: Negative for abdominal pain, diarrhea, nausea and vomiting.  Genitourinary: Negative for difficulty urinating and vaginal discharge.       Some discomfort with urination.   Musculoskeletal: Negative for joint swelling.       Left arm pain as outlined.    Skin: Negative for color change and rash.  Neurological: Negative for dizziness, light-headedness and headaches.  Psychiatric/Behavioral: Negative for agitation and dysphoric mood.       Objective:    Physical Exam Constitutional:      General: She is not in acute distress.    Appearance: Normal appearance.  HENT:     Head: Normocephalic and atraumatic.     Right Ear: External ear normal.     Left Ear: External ear normal.  Eyes:     General: No scleral icterus.       Right eye: No discharge.        Left eye: No discharge.     Conjunctiva/sclera: Conjunctivae normal.  Neck:     Thyroid: No thyromegaly.  Cardiovascular:     Rate and Rhythm: Normal rate and regular rhythm.  Pulmonary:     Effort: No respiratory distress.     Breath sounds: Normal breath sounds. No wheezing.  Abdominal:     General: Bowel sounds are normal.     Palpations: Abdomen is soft.     Tenderness: There is no abdominal tenderness.  Musculoskeletal:        General: No swelling or tenderness.     Cervical back: Neck supple. No tenderness.  Lymphadenopathy:     Cervical: No cervical adenopathy.  Skin:    Findings: No erythema.     Comments: Rash (erythematous based - raised - rash - low back.  No other rash.   Neurological:      Mental Status: She is alert.  Psychiatric:        Mood and Affect: Mood normal.        Behavior: Behavior normal.     BP 116/78   Pulse 90   Temp (!) 97.2 F (36.2 C)   Resp 16   Wt 160 lb (72.6 kg)   LMP 10/10/2016 (Exact Date)   SpO2 98%  BMI 28.35 kg/m  Wt Readings from Last 3 Encounters:  03/01/19 160 lb (72.6 kg)  08/27/18 160 lb (72.6 kg)  12/04/17 161 lb 12.8 oz (73.4 kg)     Lab Results  Component Value Date   WBC 5.0 03/01/2019   HGB 12.9 03/01/2019   HCT 37.5 03/01/2019   PLT 254 03/01/2019   GLUCOSE 107 (H) 03/01/2019   CHOL 140 03/01/2019   TRIG 117 03/01/2019   HDL 53 03/01/2019   LDLCALC 66 03/01/2019   ALT 23 03/01/2019   AST 18 03/01/2019   NA 139 03/01/2019   K 4.3 03/01/2019   CL 104 03/01/2019   CREATININE 0.86 03/01/2019   BUN 11 03/01/2019   CO2 23 03/01/2019   TSH 1.400 09/11/2018   HGBA1C 5.4 08/21/2017    MM 3D SCREEN BREAST BILATERAL  Result Date: 02/05/2019 CLINICAL DATA:  Screening. EXAM: DIGITAL SCREENING BILATERAL MAMMOGRAM WITH TOMO AND CAD COMPARISON:  Previous exam(s). ACR Breast Density Category c: The breast tissue is heterogeneously dense, which may obscure small masses. FINDINGS: There are no findings suspicious for malignancy. Images were processed with CAD. IMPRESSION: No mammographic evidence of malignancy. A result letter of this screening mammogram will be mailed directly to the patient. RECOMMENDATION: Screening mammogram in one year. (Code:SM-B-01Y) BI-RADS CATEGORY  1: Negative. Electronically Signed   By: Abelardo Diesel M.D.   On: 02/05/2019 15:50       Assessment & Plan:   Problem List Items Addressed This Visit    Dysuria    Has noticed some discomfort with urination. Check urine to confirm no infection. Follow.       Relevant Orders   Urinalysis, Routine w reflex microscopic (Completed)   Urine Culture   Foot pain    Describes sensation of walking on bubbles.  Able to feel pinprick and light touch - distal  toes/feet.  Discussed neuropathy.  Check B12. Consider NCS.         Relevant Orders   Vitamin B12 (Completed)   GERD (gastroesophageal reflux disease)    On nexium.  Controlled.        Left arm pain - Primary    Persistent left arm pain.  Positional.  Refer to ortho.       Relevant Orders   Ambulatory referral to Orthopedic Surgery   CBC with Differential/Platelet (Completed)   Comprehensive metabolic panel (Completed)   Rash    Persistent ras - low back.  Triamcinolone cream as directed.  Call with update.      Vitamin D deficiency    Follow vitamin d level.       Relevant Orders   VITAMIN D 25 Hydroxy (Vit-D Deficiency, Fractures) (Completed)    Other Visit Diagnoses    Screening cholesterol level       Relevant Orders   Lipid panel (Completed)       Einar Pheasant, MD

## 2019-03-02 LAB — VITAMIN D 25 HYDROXY (VIT D DEFICIENCY, FRACTURES): Vit D, 25-Hydroxy: 15.9 ng/mL — ABNORMAL LOW (ref 30.0–100.0)

## 2019-03-02 LAB — COMPREHENSIVE METABOLIC PANEL
ALT: 23 IU/L (ref 0–32)
AST: 18 IU/L (ref 0–40)
Albumin/Globulin Ratio: 1.3 (ref 1.2–2.2)
Albumin: 4.1 g/dL (ref 3.8–4.9)
Alkaline Phosphatase: 137 IU/L — ABNORMAL HIGH (ref 39–117)
BUN/Creatinine Ratio: 13 (ref 9–23)
BUN: 11 mg/dL (ref 6–24)
Bilirubin Total: 0.4 mg/dL (ref 0.0–1.2)
CO2: 23 mmol/L (ref 20–29)
Calcium: 9.5 mg/dL (ref 8.7–10.2)
Chloride: 104 mmol/L (ref 96–106)
Creatinine, Ser: 0.86 mg/dL (ref 0.57–1.00)
GFR calc Af Amer: 88 mL/min/{1.73_m2} (ref >59)
GFR calc non Af Amer: 76 mL/min/{1.73_m2} (ref >59)
Globulin, Total: 3.1 g/dL (ref 1.5–4.5)
Glucose: 107 mg/dL — ABNORMAL HIGH (ref 65–99)
Potassium: 4.3 mmol/L (ref 3.5–5.2)
Sodium: 139 mmol/L (ref 134–144)
Total Protein: 7.2 g/dL (ref 6.0–8.5)

## 2019-03-02 LAB — CBC WITH DIFFERENTIAL/PLATELET
Basophils Absolute: 0 10*3/uL (ref 0.0–0.2)
Basos: 0 %
EOS (ABSOLUTE): 0.1 10*3/uL (ref 0.0–0.4)
Eos: 1 %
Hematocrit: 37.5 % (ref 34.0–46.6)
Hemoglobin: 12.9 g/dL (ref 11.1–15.9)
Immature Grans (Abs): 0 10*3/uL (ref 0.0–0.1)
Immature Granulocytes: 0 %
Lymphocytes Absolute: 1.4 10*3/uL (ref 0.7–3.1)
Lymphs: 27 %
MCH: 31 pg (ref 26.6–33.0)
MCHC: 34.4 g/dL (ref 31.5–35.7)
MCV: 90 fL (ref 79–97)
Monocytes Absolute: 0.4 10*3/uL (ref 0.1–0.9)
Monocytes: 8 %
Neutrophils Absolute: 3.1 10*3/uL (ref 1.4–7.0)
Neutrophils: 64 %
Platelets: 254 10*3/uL (ref 150–450)
RBC: 4.16 x10E6/uL (ref 3.77–5.28)
RDW: 12.6 % (ref 11.7–15.4)
WBC: 5 10*3/uL (ref 3.4–10.8)

## 2019-03-02 LAB — LIPID PANEL
Chol/HDL Ratio: 2.6 ratio (ref 0.0–4.4)
Cholesterol, Total: 140 mg/dL (ref 100–199)
HDL: 53 mg/dL (ref >39)
LDL Chol Calc (NIH): 66 mg/dL (ref 0–99)
Triglycerides: 117 mg/dL (ref 0–149)
VLDL Cholesterol Cal: 21 mg/dL (ref 5–40)

## 2019-03-02 LAB — VITAMIN B12: Vitamin B-12: 317 pg/mL (ref 232–1245)

## 2019-03-03 ENCOUNTER — Telehealth: Payer: Self-pay | Admitting: Internal Medicine

## 2019-03-03 NOTE — Telephone Encounter (Signed)
Pt said you can leave a message on her cell phone with results or you can call her back at work but don't leave a message there. She also said you can send her a myChart message.

## 2019-03-03 NOTE — Telephone Encounter (Signed)
See my chart message

## 2019-03-05 LAB — URINALYSIS, ROUTINE W REFLEX MICROSCOPIC
Bilirubin, UA: NEGATIVE
Glucose, UA: NEGATIVE
Ketones, UA: NEGATIVE
Nitrite, UA: NEGATIVE
Protein,UA: NEGATIVE
RBC, UA: NEGATIVE
Specific Gravity, UA: 1.029 (ref 1.005–1.030)
Urobilinogen, Ur: 0.2 mg/dL (ref 0.2–1.0)
pH, UA: 5.5 (ref 5.0–7.5)

## 2019-03-05 LAB — MICROSCOPIC EXAMINATION
Casts: NONE SEEN /lpf
RBC: NONE SEEN /hpf (ref 0–2)

## 2019-03-05 LAB — URINE CULTURE

## 2019-03-07 ENCOUNTER — Encounter: Payer: Self-pay | Admitting: Internal Medicine

## 2019-03-07 NOTE — Assessment & Plan Note (Signed)
Persistent left arm pain.  Positional.  Refer to ortho.

## 2019-03-07 NOTE — Assessment & Plan Note (Signed)
Persistent ras - low back.  Triamcinolone cream as directed.  Call with update.

## 2019-03-07 NOTE — Assessment & Plan Note (Signed)
Has noticed some discomfort with urination. Check urine to confirm no infection. Follow.

## 2019-03-07 NOTE — Assessment & Plan Note (Addendum)
Describes sensation of walking on bubbles.  Able to feel pinprick and light touch - distal toes/feet.  Discussed neuropathy.  Check B12. Consider NCS.

## 2019-03-07 NOTE — Assessment & Plan Note (Signed)
Follow vitamin d level.   

## 2019-03-07 NOTE — Assessment & Plan Note (Signed)
On nexium.  Controlled.

## 2019-05-04 ENCOUNTER — Encounter: Payer: Self-pay | Admitting: Internal Medicine

## 2019-06-29 ENCOUNTER — Ambulatory Visit: Payer: 59 | Admitting: Internal Medicine

## 2019-07-05 ENCOUNTER — Encounter: Payer: Self-pay | Admitting: Internal Medicine

## 2019-07-07 NOTE — Telephone Encounter (Signed)
Called and spoke to pt.  She has already received the vaccine.  She received the vaccine yesterday.  Not having any problems.  Will notify me if problems.

## 2019-08-17 ENCOUNTER — Encounter: Payer: Self-pay | Admitting: Internal Medicine

## 2019-08-18 ENCOUNTER — Other Ambulatory Visit: Payer: Self-pay | Admitting: Internal Medicine

## 2019-08-20 ENCOUNTER — Ambulatory Visit: Payer: 59 | Admitting: Internal Medicine

## 2019-11-09 ENCOUNTER — Encounter: Payer: Self-pay | Admitting: Internal Medicine

## 2019-11-09 DIAGNOSIS — Z992 Dependence on renal dialysis: Secondary | ICD-10-CM

## 2019-11-09 DIAGNOSIS — K649 Unspecified hemorrhoids: Secondary | ICD-10-CM

## 2019-11-10 NOTE — Telephone Encounter (Signed)
Left message for patient to return call to office need more information on Possible Hemorrhoid.

## 2019-11-10 NOTE — Telephone Encounter (Signed)
Patient returned call  And scheduled visit with NP, advised patient also she could ask Pharmacist for OTC recommendation Tuck's pads , patient says she has had hemorrhoids before and she know what these are, she will try OTC products and keep appt for Friday.

## 2019-11-10 NOTE — Telephone Encounter (Signed)
Can sen in analpram cream to apply topically bid.  If pain increases, needs to be seen (question of thrombosed hemorrhoid).

## 2019-11-11 MED ORDER — HYDROCORTISONE ACE-PRAMOXINE 1-1 % EX CREA: 1.0000 "application " | TOPICAL_CREAM | Freq: Two times a day (BID) | CUTANEOUS | 0 refills | Status: DC

## 2019-11-11 NOTE — Telephone Encounter (Signed)
Medication sent and patient has appointment for Friday.

## 2019-11-12 ENCOUNTER — Encounter: Payer: Self-pay | Admitting: Nurse Practitioner

## 2019-11-12 ENCOUNTER — Ambulatory Visit: Payer: 59 | Admitting: Nurse Practitioner

## 2019-11-12 ENCOUNTER — Other Ambulatory Visit: Payer: Self-pay

## 2019-11-12 VITALS — BP 118/80 | HR 78 | Temp 98.0°F | Ht 62.9 in | Wt 157.0 lb

## 2019-11-12 DIAGNOSIS — K645 Perianal venous thrombosis: Secondary | ICD-10-CM | POA: Diagnosis not present

## 2019-11-12 NOTE — Progress Notes (Signed)
Established Patient Office Visit  Subjective:  Patient ID: Sabrina Schneider, female    DOB: [birth year]  Age: 54 y.o. MRN: [id number]  CC:  Chief Complaint  Patient presents with  . Acute Visit    hemorrhoids    HPI Sabrina Schneider is a 54 yo who presents for hemorrhoidal concerns. She has painful swelling since Mon of this week. She recalls hemorrhoids in the past- but never this painful. She feels small, hard knots and is painful to sit. No bleeding and normal BM.  She called in with this complaint and was given Proctofoam cream hCG has been using that since last night.     Past Medical History:  Diagnosis Date  . Anemia   . BRCA negative   . Family history of breast cancer    daughter had breast cancer at age 43-BRCA positive. Patient is BRCA negative  . GERD (gastroesophageal reflux disease)   . Hiatal hernia     Past Surgical History:  Procedure Laterality Date  . BREAST CYST ASPIRATION  2010/ 2018   Right breast cyst aspiration  . CESAREAN SECTION  03/18/2001   fetal indications  . COLONOSCOPY N/A 06/01/2014   Procedure: COLONOSCOPY;  Surgeon: Robert Bellow, MD;  Location: [facility] ENDOSCOPY;  Service: Endoscopy;  Laterality: N/A;  . DILATION AND CURETTAGE OF UTERUS     tab  . UPPER GI ENDOSCOPY  2015   Dr. Vira Agar    Family History  Problem Relation Age of Onset  . Lung cancer Father 64  . Hypertension Mother   . Diabetes Mother   . Heart disease Mother   . Breast cancer Daughter 51       BCRA2 mutation   . Diabetes Sister   . Cervical cancer Sister   . Prostate cancer Maternal Grandfather 22  . Diabetes Sister   . Cervical cancer Sister 60    Social History   Socioeconomic History  . Marital status: Single    Spouse name: Not on file  . Number of children: 2  . Years of education: Not on file  . Highest education level: Associate degree: academic program  Occupational History  . Occupation: [name]  Tobacco Use  . Smoking  status: Never Smoker  . Smokeless tobacco: Never Used  Vaping Use  . Vaping Use: Never used  Substance and Sexual Activity  . Alcohol use: Yes    Alcohol/week: 0.0 standard drinks    Comment: occasionally  . Drug use: No  . Sexual activity: Yes    Partners: Male    Birth control/protection: None  Other Topics Concern  . Not on file  Social History Narrative  . Not on file   Social Determinants of Health   Financial Resource Strain:   . Difficulty of Paying Living Expenses: Not on file  Food Insecurity:   . Worried About Charity fundraiser in the Last Year: Not on file  . Ran Out of Food in the Last Year: Not on file  Transportation Needs:   . Lack of Transportation (Medical): Not on file  . Lack of Transportation (Non-Medical): Not on file  Physical Activity:   . Days of Exercise per Week: Not on file  . Minutes of Exercise per Session: Not on file  Stress:   . Feeling of Stress : Not on file  Social Connections:   . Frequency of Communication with Friends and Family: Not on file  . Frequency of Social Gatherings with  Friends and Family: Not on file  . Attends Religious Services: Not on file  . Active Member of Clubs or Organizations: Not on file  . Attends Archivist Meetings: Not on file  . Marital Status: Not on file  Intimate Partner Violence:   . Fear of Current or Ex-Partner: Not on file  . Emotionally Abused: Not on file  . Physically Abused: Not on file  . Sexually Abused: Not on file    Outpatient Medications Prior to Visit  Medication Sig Dispense Refill  . esomeprazole (NEXIUM) 40 MG packet MIX CONTENTS OF THE  CONTENTS OF 1 PACKET WITH  TABLESPOONSFUL WATER WAIT 2 TO 3 MINUTES THEN DRINK  DAILY BEFORE BREAKFAST 90 each 3  . pramoxine-hydrocortisone (PROCTOCREAM-HC) 1-1 % rectal cream Place 1 application rectally 2 (two) times daily. 30 g 0  . triamcinolone cream (KENALOG) 0.1 % Apply 1 application topically 2 (two) times daily. 30 g 0   No  facility-administered medications prior to visit.    No Known Allergies  Review of Systems  Constitutional: Negative for chills and fever.  HENT: Negative.   Gastrointestinal: Positive for rectal pain. Negative for abdominal pain, blood in stool, constipation and diarrhea.  Genitourinary: Negative for difficulty urinating.  Musculoskeletal: Negative for back pain.  Hematological: Does not bruise/bleed easily.      Objective:    Physical Exam Vitals reviewed. Exam conducted with a chaperone present.  Constitutional:      Appearance: She is normal weight.  Cardiovascular:     Rate and Rhythm: Normal rate and regular rhythm.     Pulses: Normal pulses.     Heart sounds: Normal heart sounds.  Pulmonary:     Breath sounds: Normal breath sounds.  Abdominal:     Palpations: Abdomen is soft.     Tenderness: There is no abdominal tenderness.     Comments: DRE with light brown stool- soft and hemoccult negative. Sphincter tone is normal. No stool soiling. No fissure. No palpable mass. Positive ext hemorrhoids- no bleeding.    Genitourinary:    Rectum: Normal.    Musculoskeletal:        General: Normal range of motion.  Skin:    General: Skin is warm and dry.     Findings: No rash.  Neurological:     General: No focal deficit present.     Mental Status: She is alert and oriented to person, place, and time.  Psychiatric:        Mood and Affect: Mood normal.        Behavior: Behavior normal.    BP 118/80 (BP Location: Left Arm, Patient Position: Sitting, Cuff Size: Normal)   Pulse 78   Temp 98 F (36.7 C) (Oral)   Ht 5' 2.9" (1.598 m)   Wt 157 lb (71.2 kg)   LMP 10/10/2016 (Exact Date)   SpO2 98%   BMI 27.90 kg/m  Wt Readings from Last 3 Encounters:  11/12/19 157 lb (71.2 kg)  03/01/19 160 lb (72.6 kg)  08/27/18 160 lb (72.6 kg)   Pulse Readings from Last 3 Encounters:  11/12/19 78  03/01/19 90  08/27/18 72    BP Readings from Last 3 Encounters:  11/12/19 118/80   03/01/19 116/78  08/27/18 128/70    Lab Results  Component Value Date   CHOL 140 03/01/2019   HDL 53 03/01/2019   LDLCALC 66 03/01/2019   TRIG 117 03/01/2019   CHOLHDL 2.6 03/01/2019      Health  Maintenance Due  Topic Date Due  . Hepatitis C Screening  Never done  . HIV Screening  Never done  . PAP SMEAR-Modifier  01/10/2020    There are no preventive care reminders to display for this patient.  Lab Results  Component Value Date   TSH 1.400 09/11/2018   Lab Results  Component Value Date   WBC 5.0 03/01/2019   HGB 12.9 03/01/2019   HCT 37.5 03/01/2019   MCV 90 03/01/2019   PLT 254 03/01/2019   Lab Results  Component Value Date   NA 139 03/01/2019   K 4.3 03/01/2019   CO2 23 03/01/2019   GLUCOSE 107 (H) 03/01/2019   BUN 11 03/01/2019   CREATININE 0.86 03/01/2019   BILITOT 0.4 03/01/2019   ALKPHOS 137 (H) 03/01/2019   AST 18 03/01/2019   ALT 23 03/01/2019   PROT 7.2 03/01/2019   ALBUMIN 4.1 03/01/2019   CALCIUM 9.5 03/01/2019   Lab Results  Component Value Date   CHOL 140 03/01/2019   Lab Results  Component Value Date   HDL 53 03/01/2019   Lab Results  Component Value Date   LDLCALC 66 03/01/2019   Lab Results  Component Value Date   TRIG 117 03/01/2019   Lab Results  Component Value Date   CHOLHDL 2.6 03/01/2019   Lab Results  Component Value Date   HGBA1C 5.4 08/21/2017      Assessment & Plan:   Problem List Items Addressed This Visit    None    Visit Diagnoses    Thrombosed external hemorrhoids    -  Primary   Relevant Orders   Ambulatory referral to General Surgery     No orders of the defined types were placed in this encounter.  Please use the medication Dr. Nicki Reaper called in for you- apply as directed.   Sit in very warm water- sitz bath to help heal the hemorrhoids x 3 times per day as able.   Stool must be very easy to pass and not hard in order for these to heal. Begin Citrucel daily x 1 dose for fiber and drink 2-3  extra glasses of fluid. Try docusate sodium 100 mg 1-2 times daily as needed to soften the stool for easier BM. This is sold over the counter. You do not want diarrhea as that will aggravate the hemorrhoids.   I have placed referral in to Dr. Bary Castilla in general surgery to evaluate further if the hemorrhoids do not resolve as expected.  You have hemorrhoid tags in that area as well and he may be able to help you with trimming those for easier hygiene.   Follow-up: No follow-ups on file.  This visit occurred during the SARS-CoV-2 public health emergency.  Safety protocols were in place, including screening questions prior to the visit, additional usage of staff PPE, and extensive cleaning of exam room while observing appropriate contact time as indicated for disinfecting solutions.    Denice Paradise, NP

## 2019-11-12 NOTE — Patient Instructions (Addendum)
Please use the medication Dr. Nicki Reaper called in for you- apply as directed.   Sit in very warm water- sitz bath to help heal the hemorrhoids x 3 times per day as able.   Stool must be very easy to pass and not hard in order for these to heal. Begin Citrucel daily x 1 dose for fiber and drink 2-3 extra glasses of fluid. Try docusate sodium 100 mg 1-2 times daily as needed to soften the stool for easier BM. This is sold over the counter. You do not want diarrhea as that will aggravate the hemorrhoids.   I have placed referral in to Dr. Bary Castilla in general surgery to evaluate further if the hemorrhoids do not resolve as expected.  You have hemorrhoid tags in that area as well and he may be able to help you with trimming those for easier hygiene.      Hemorrhoids Hemorrhoids are swollen veins in and around the rectum or anus. There are two types of hemorrhoids:  Internal hemorrhoids. These occur in the veins that are just inside the rectum. They may poke through to the outside and become irritated and painful.  External hemorrhoids. These occur in the veins that are outside the anus and can be felt as a painful swelling or hard lump near the anus. Most hemorrhoids do not cause serious problems, and they can be managed with home treatments such as diet and lifestyle changes. If home treatments do not help the symptoms, procedures can be done to shrink or remove the hemorrhoids. What are the causes? This condition is caused by increased pressure in the anal area. This pressure may result from various things, including:  Constipation.  Straining to have a bowel movement.  Diarrhea.  Pregnancy.  Obesity.  Sitting for long periods of time.  Heavy lifting or other activity that causes you to strain.  Anal sex.  Riding a bike for a long period of time. What are the signs or symptoms? Symptoms of this condition include:  Pain.  Anal itching or irritation.  Rectal bleeding.  Leakage of  stool (feces).  Anal swelling.  One or more lumps around the anus. How is this diagnosed? This condition can often be diagnosed through a visual exam. Other exams or tests may also be done, such as:  An exam that involves feeling the rectal area with a gloved hand (digital rectal exam).  An exam of the anal canal that is done using a small tube (anoscope).  A blood test, if you have lost a significant amount of blood.  A test to look inside the colon using a flexible tube with a camera on the end (sigmoidoscopy or colonoscopy). How is this treated? This condition can usually be treated at home. However, various procedures may be done if dietary changes, lifestyle changes, and other home treatments do not help your symptoms. These procedures can help make the hemorrhoids smaller or remove them completely. Some of these procedures involve surgery, and others do not. Common procedures include:  Rubber band ligation. Rubber bands are placed at the base of the hemorrhoids to cut off their blood supply.  Sclerotherapy. Medicine is injected into the hemorrhoids to shrink them.  Infrared coagulation. A type of light energy is used to get rid of the hemorrhoids.  Hemorrhoidectomy surgery. The hemorrhoids are surgically removed, and the veins that supply them are tied off.  Stapled hemorrhoidopexy surgery. The surgeon staples the base of the hemorrhoid to the rectal wall. Follow these instructions  at home: Eating and drinking   Eat foods that have a lot of fiber in them, such as whole grains, beans, nuts, fruits, and vegetables.  Ask your health care provider about taking products that have added fiber (fiber supplements).  Reduce the amount of fat in your diet. You can do this by eating low-fat dairy products, eating less red meat, and avoiding processed foods.  Drink enough fluid to keep your urine pale yellow. Managing pain and swelling   Take warm sitz baths for 20 minutes, 3-4  times a day to ease pain and discomfort. You may do this in a bathtub or using a portable sitz bath that fits over the toilet.  If directed, apply ice to the affected area. Using ice packs between sitz baths may be helpful. ? Put ice in a plastic bag. ? Place a towel between your skin and the bag. ? Leave the ice on for 20 minutes, 2-3 times a day. General instructions  Take over-the-counter and prescription medicines only as told by your health care provider.  Use medicated creams or suppositories as told.  Get regular exercise. Ask your health care provider how much and what kind of exercise is best for you. In general, you should do moderate exercise for at least 30 minutes on most days of the week (150 minutes each week). This can include activities such as walking, biking, or yoga.  Go to the bathroom when you have the urge to have a bowel movement. Do not wait.  Avoid straining to have bowel movements.  Keep the anal area dry and clean. Use wet toilet paper or moist towelettes after a bowel movement.  Do not sit on the toilet for long periods of time. This increases blood pooling and pain.  Keep all follow-up visits as told by your health care provider. This is important. Contact a health care provider if you have:  Increasing pain and swelling that are not controlled by treatment or medicine.  Difficulty having a bowel movement, or you are unable to have a bowel movement.  Pain or inflammation outside the area of the hemorrhoids. Get help right away if you have:  Uncontrolled bleeding from your rectum. Summary  Hemorrhoids are swollen veins in and around the rectum or anus.  Most hemorrhoids can be managed with home treatments such as diet and lifestyle changes.  Taking warm sitz baths can help ease pain and discomfort.  In severe cases, procedures or surgery can be done to shrink or remove the hemorrhoids. This information is not intended to replace advice given to  you by your health care provider. Make sure you discuss any questions you have with your health care provider. Document Revised: 05/29/2018 Document Reviewed: 05/22/2017 Elsevier Patient Education  [place].

## 2019-11-14 ENCOUNTER — Encounter: Payer: Self-pay | Admitting: Nurse Practitioner

## 2020-02-11 ENCOUNTER — Encounter: Payer: Self-pay | Admitting: Internal Medicine

## 2020-03-02 ENCOUNTER — Other Ambulatory Visit: Payer: Self-pay

## 2020-03-02 ENCOUNTER — Ambulatory Visit (INDEPENDENT_AMBULATORY_CARE_PROVIDER_SITE_OTHER): Payer: 59 | Admitting: Internal Medicine

## 2020-03-02 VITALS — BP 120/70 | HR 76 | Temp 98.3°F | Resp 16 | Ht 63.0 in | Wt 152.2 lb

## 2020-03-02 DIAGNOSIS — E559 Vitamin D deficiency, unspecified: Secondary | ICD-10-CM | POA: Diagnosis not present

## 2020-03-02 DIAGNOSIS — R3989 Other symptoms and signs involving the genitourinary system: Secondary | ICD-10-CM

## 2020-03-02 DIAGNOSIS — Z13220 Encounter for screening for lipoid disorders: Secondary | ICD-10-CM

## 2020-03-02 DIAGNOSIS — Z0000 Encounter for general adult medical examination without abnormal findings: Secondary | ICD-10-CM | POA: Diagnosis not present

## 2020-03-02 DIAGNOSIS — Z8616 Personal history of COVID-19: Secondary | ICD-10-CM

## 2020-03-02 DIAGNOSIS — K219 Gastro-esophageal reflux disease without esophagitis: Secondary | ICD-10-CM

## 2020-03-02 DIAGNOSIS — E538 Deficiency of other specified B group vitamins: Secondary | ICD-10-CM

## 2020-03-02 DIAGNOSIS — Z1231 Encounter for screening mammogram for malignant neoplasm of breast: Secondary | ICD-10-CM | POA: Diagnosis not present

## 2020-03-02 NOTE — Progress Notes (Signed)
Patient ID: Sabrina Schneider, female   DOB: 10/02/1965, 54 y.o.   MRN: [id number]   Subjective:    Patient ID: Sabrina Schneider, female    DOB: 06/22/1965, 54 y.o.   MRN: [id number]  HPI This visit occurred during the SARS-CoV-2 public health emergency.  Safety protocols were in place, including screening questions prior to the visit, additional usage of staff PPE, and extensive cleaning of exam room while observing appropriate contact time as indicated for disinfecting solutions.  Patient here for her physical exam. She reports she is doing relatively well.  Tries to stay active.  No chest pain or sob reported.  Had covid around 02/07/20.  Better now.  Occasional minimal cough and some decreased energy, but overall improved and continuing to improve.  No abdominal pain or bowel change reported.  Left arm is better.  Feet - feel normal.  Hemorrhoid better. Saw Dr Byrnett. No further intervention felt warranted.  Some occasional urinary pressure.     Past Medical History:  Diagnosis Date  . Anemia   . BRCA negative   . Family history of breast cancer    daughter had breast cancer at age 22-BRCA positive. Patient is BRCA negative  . GERD (gastroesophageal reflux disease)   . Hiatal hernia    Past Surgical History:  Procedure Laterality Date  . BREAST CYST ASPIRATION  2010/ 2018   Right breast cyst aspiration  . CESAREAN SECTION  03/18/2001   fetal indications  . COLONOSCOPY N/A 06/01/2014   Procedure: COLONOSCOPY;  Surgeon: Jeffrey W Byrnett, MD;  Location: [facility] ENDOSCOPY;  Service: Endoscopy;  Laterality: N/A;  . DILATION AND CURETTAGE OF UTERUS     tab  . UPPER GI ENDOSCOPY  2015   Dr. Elliott   Family History  Problem Relation Age of Onset  . Lung cancer Father 68  . Hypertension Mother   . Diabetes Mother   . Heart disease Mother   . Breast cancer Daughter 22       BCRA2 mutation   . Diabetes Sister   . Cervical cancer Sister   . Prostate cancer Maternal Grandfather 70  . Diabetes  Sister   . Cervical cancer Sister 45   Social History   Socioeconomic History  . Marital status: Single    Spouse name: Not on file  . Number of children: 2  . Years of education: Not on file  . Highest education level: Associate degree: academic program  Occupational History  . Occupation: [name]  Tobacco Use  . Smoking status: Never Smoker  . Smokeless tobacco: Never Used  Vaping Use  . Vaping Use: Never used  Substance and Sexual Activity  . Alcohol use: Yes    Alcohol/week: 0.0 standard drinks    Comment: occasionally  . Drug use: No  . Sexual activity: Yes    Partners: Male    Birth control/protection: None  Other Topics Concern  . Not on file  Social History Narrative  . Not on file   Social Determinants of Health   Financial Resource Strain: Not on file  Food Insecurity: Not on file  Transportation Needs: Not on file  Physical Activity: Not on file  Stress: Not on file  Social Connections: Not on file    Outpatient Encounter Medications as of 03/02/2020  Medication Sig  . esomeprazole (NEXIUM) 40 MG packet MIX CONTENTS OF THE  CONTENTS OF 1 PACKET WITH  TABLESPOONSFUL WATER WAIT 2 TO 3 MINUTES THEN DRINK    DAILY BEFORE BREAKFAST  . pramoxine-hydrocortisone (PROCTOCREAM-HC) 1-1 % rectal cream Place 1 application rectally 2 (two) times daily.  . triamcinolone cream (KENALOG) 0.1 % Apply 1 application topically 2 (two) times daily.   No facility-administered encounter medications on file as of 03/02/2020.    Review of Systems  Constitutional: Negative for appetite change and unexpected weight change.  HENT: Negative for congestion, sinus pressure and sore throat.   Eyes: Negative for pain and visual disturbance.  Respiratory: Negative for chest tightness and shortness of breath.        No significant cough.   Cardiovascular: Negative for chest pain, palpitations and leg swelling.  Gastrointestinal: Negative for abdominal pain, diarrhea,  nausea and vomiting.  Genitourinary: Negative for difficulty urinating and dysuria.  Musculoskeletal: Negative for back pain and joint swelling.  Skin: Negative for color change and rash.  Neurological: Negative for dizziness, light-headedness and headaches.  Hematological: Negative for adenopathy. Does not bruise/bleed easily.  Psychiatric/Behavioral: Negative for decreased concentration and dysphoric mood.       Objective:    Physical Exam Vitals reviewed.  Constitutional:      General: She is not in acute distress.    Appearance: Normal appearance. She is well-developed and well-nourished.  HENT:     Head: Normocephalic and atraumatic.     Right Ear: External ear normal.     Left Ear: External ear normal.     Mouth/Throat:     Mouth: Oropharynx is clear and moist.  Eyes:     General: No scleral icterus.       Right eye: No discharge.        Left eye: No discharge.  Neck:     Thyroid: No thyromegaly.  Cardiovascular:     Rate and Rhythm: Normal rate and regular rhythm.  Pulmonary:     Effort: No tachypnea, accessory muscle usage or respiratory distress.     Breath sounds: Normal breath sounds. No decreased breath sounds or wheezing.  Chest:  Breasts:     Right: No inverted nipple, mass, nipple discharge or tenderness (no axillary adenopathy).     Left: No inverted nipple, mass, nipple discharge or tenderness (no axilarry adenopathy).    Abdominal:     General: Bowel sounds are normal.     Palpations: Abdomen is soft.     Tenderness: There is no abdominal tenderness.  Musculoskeletal:        General: No swelling, tenderness or edema.     Cervical back: Neck supple. No tenderness.  Lymphadenopathy:     Cervical: No cervical adenopathy.  Skin:    Findings: No erythema or rash.  Neurological:     Mental Status: She is alert and oriented to person, place, and time.  Psychiatric:        Mood and Affect: Mood and affect and mood normal.        Behavior: Behavior  normal.     BP 120/70   Pulse 76   Temp 98.3 F (36.8 C) (Oral)   Resp 16   Ht 5' 3" (1.6 m)   Wt 152 lb 3.2 oz (69 kg)   LMP 10/10/2016 (Exact Date)   SpO2 98%   BMI 26.96 kg/m  Wt Readings from Last 3 Encounters:  03/02/20 152 lb 3.2 oz (69 kg)  11/12/19 157 lb (71.2 kg)  03/01/19 160 lb (72.6 kg)     Lab Results  Component Value Date   WBC 4.1 03/02/2020   HGB 12.1 03/02/2020     HCT 35.5 03/02/2020   PLT 225 03/02/2020   GLUCOSE 92 03/02/2020   CHOL 157 03/02/2020   TRIG 94 03/02/2020   HDL 55 03/02/2020   LDLCALC 85 03/02/2020   ALT 17 03/02/2020   AST 19 03/02/2020   NA 141 03/02/2020   K 4.2 03/02/2020   CL 106 03/02/2020   CREATININE 0.81 03/02/2020   BUN 10 03/02/2020   CO2 23 03/02/2020   TSH 1.390 03/02/2020   HGBA1C 5.4 08/21/2017    MM 3D SCREEN BREAST BILATERAL  Result Date: 02/05/2019 CLINICAL DATA:  Screening. EXAM: DIGITAL SCREENING BILATERAL MAMMOGRAM WITH TOMO AND CAD COMPARISON:  Previous exam(s). ACR Breast Density Category c: The breast tissue is heterogeneously dense, which may obscure small masses. FINDINGS: There are no findings suspicious for malignancy. Images were processed with CAD. IMPRESSION: No mammographic evidence of malignancy. A result letter of this screening mammogram will be mailed directly to the patient. RECOMMENDATION: Screening mammogram in one year. (Code:SM-B-01Y) BI-RADS CATEGORY  1: Negative. Electronically Signed   By: Wei-Chen  Lin M.D.   On: 02/05/2019 15:50       Assessment & Plan:   Problem List Items Addressed This Visit    B12 deficiency    Check B12 level.       Relevant Orders   Vitamin B12 (Completed)   GERD (gastroesophageal reflux disease)    No upper symptoms reported. On nexium.       Healthcare maintenance    Physical today 03/02/20.  Mammogram 02/05/19 - Birads I.  Schedule f/u mammogram.  Order placed.  Colonoscopy 2016 (Dr Byrnett).  Recommended f/u in 10 years.        History of COVID-19     Recently had covid - around 02/07/20.  Doing better. Minimal cough, but no significant residual symptoms.  No sob. No chest pain.  Follow.       Sensation of pressure in bladder area    Check urine to confirm no infection.       Relevant Orders   Urinalysis, Routine w reflex microscopic (Completed)   Urine Culture (Completed)   Vitamin D deficiency    Continue vitamin D supplements.  Recheck vitamin D level.       Relevant Orders   VITAMIN D 25 Hydroxy (Vit-D Deficiency, Fractures) (Completed)    Other Visit Diagnoses    Encounter for screening mammogram for malignant neoplasm of breast    -  Primary   Relevant Orders   MM 3D SCREEN BREAST BILATERAL   Screening cholesterol level       Relevant Orders   CBC with Differential/Platelet (Completed)   Comprehensive metabolic panel (Completed)   TSH (Completed)   Lipid panel (Completed)       Charlene Scott, MD 

## 2020-03-03 LAB — COMPREHENSIVE METABOLIC PANEL
ALT: 17 IU/L (ref 0–32)
AST: 19 IU/L (ref 0–40)
Albumin/Globulin Ratio: 1.4 (ref 1.2–2.2)
Albumin: 4 g/dL (ref 3.8–4.9)
Alkaline Phosphatase: 110 IU/L (ref 44–121)
BUN/Creatinine Ratio: 12 (ref 9–23)
BUN: 10 mg/dL (ref 6–24)
Bilirubin Total: 0.5 mg/dL (ref 0.0–1.2)
CO2: 23 mmol/L (ref 20–29)
Calcium: 8.9 mg/dL (ref 8.7–10.2)
Chloride: 106 mmol/L (ref 96–106)
Creatinine, Ser: 0.81 mg/dL (ref 0.57–1.00)
GFR calc Af Amer: 94 mL/min/{1.73_m2} (ref >59)
GFR calc non Af Amer: 81 mL/min/{1.73_m2} (ref >59)
Globulin, Total: 2.9 g/dL (ref 1.5–4.5)
Glucose: 92 mg/dL (ref 65–99)
Potassium: 4.2 mmol/L (ref 3.5–5.2)
Sodium: 141 mmol/L (ref 134–144)
Total Protein: 6.9 g/dL (ref 6.0–8.5)

## 2020-03-03 LAB — CBC WITH DIFFERENTIAL/PLATELET
Basophils Absolute: 0 10*3/uL (ref 0.0–0.2)
Basos: 1 %
EOS (ABSOLUTE): 0.1 10*3/uL (ref 0.0–0.4)
Eos: 2 %
Hematocrit: 35.5 % (ref 34.0–46.6)
Hemoglobin: 12.1 g/dL (ref 11.1–15.9)
Immature Grans (Abs): 0 10*3/uL (ref 0.0–0.1)
Immature Granulocytes: 0 %
Lymphocytes Absolute: 1.8 10*3/uL (ref 0.7–3.1)
Lymphs: 44 %
MCH: 31.1 pg (ref 26.6–33.0)
MCHC: 34.1 g/dL (ref 31.5–35.7)
MCV: 91 fL (ref 79–97)
Monocytes Absolute: 0.4 10*3/uL (ref 0.1–0.9)
Monocytes: 9 %
Neutrophils Absolute: 1.8 10*3/uL (ref 1.4–7.0)
Neutrophils: 44 %
Platelets: 225 10*3/uL (ref 150–450)
RBC: 3.89 x10E6/uL (ref 3.77–5.28)
RDW: 12.3 % (ref 11.7–15.4)
WBC: 4.1 10*3/uL (ref 3.4–10.8)

## 2020-03-03 LAB — LIPID PANEL
Chol/HDL Ratio: 2.9 ratio (ref 0.0–4.4)
Cholesterol, Total: 157 mg/dL (ref 100–199)
HDL: 55 mg/dL (ref >39)
LDL Chol Calc (NIH): 85 mg/dL (ref 0–99)
Triglycerides: 94 mg/dL (ref 0–149)
VLDL Cholesterol Cal: 17 mg/dL (ref 5–40)

## 2020-03-03 LAB — TSH: TSH: 1.39 u[IU]/mL (ref 0.450–4.500)

## 2020-03-03 LAB — VITAMIN B12: Vitamin B-12: 303 pg/mL (ref 232–1245)

## 2020-03-03 LAB — VITAMIN D 25 HYDROXY (VIT D DEFICIENCY, FRACTURES): Vit D, 25-Hydroxy: 14.5 ng/mL — ABNORMAL LOW (ref 30.0–100.0)

## 2020-03-04 LAB — URINALYSIS, ROUTINE W REFLEX MICROSCOPIC
Bilirubin, UA: NEGATIVE
Glucose, UA: NEGATIVE
Ketones, UA: NEGATIVE
Leukocytes,UA: NEGATIVE
Nitrite, UA: NEGATIVE
Protein,UA: NEGATIVE
RBC, UA: NEGATIVE
Specific Gravity, UA: 1.024 (ref 1.005–1.030)
Urobilinogen, Ur: 0.2 mg/dL (ref 0.2–1.0)
pH, UA: 6 (ref 5.0–7.5)

## 2020-03-04 LAB — URINE CULTURE

## 2020-03-05 ENCOUNTER — Encounter: Payer: Self-pay | Admitting: Internal Medicine

## 2020-03-05 DIAGNOSIS — Z8616 Personal history of COVID-19: Secondary | ICD-10-CM | POA: Insufficient documentation

## 2020-03-05 NOTE — Assessment & Plan Note (Signed)
Check B12 level. 

## 2020-03-05 NOTE — Assessment & Plan Note (Signed)
No upper symptoms reported.  On nexium.   

## 2020-03-05 NOTE — Assessment & Plan Note (Signed)
Continue vitamin D supplements.  Recheck vitamin D level.

## 2020-03-05 NOTE — Assessment & Plan Note (Signed)
Recently had covid - around 02/07/20.  Doing better. Minimal cough, but no significant residual symptoms.  No sob. No chest pain.  Follow.

## 2020-03-05 NOTE — Assessment & Plan Note (Signed)
Check urine to confirm no infection.  

## 2020-03-05 NOTE — Assessment & Plan Note (Addendum)
Physical today 03/02/20.  Mammogram 02/05/19 - Birads I.  Schedule f/u mammogram.  Order placed.  Colonoscopy 2016 (Dr Bary Castilla).  Recommended f/u in 10 years.

## 2020-03-06 ENCOUNTER — Encounter: Payer: Self-pay | Admitting: Internal Medicine

## 2020-04-13 ENCOUNTER — Ambulatory Visit
Admission: RE | Admit: 2020-04-13 | Discharge: 2020-04-13 | Disposition: A | Discharge status: Home / Self Care | Payer: 59 | Source: Ambulatory Visit | Attending: Internal Medicine | Admitting: Internal Medicine

## 2020-04-13 ENCOUNTER — Other Ambulatory Visit: Payer: Self-pay

## 2020-04-13 DIAGNOSIS — Z1231 Encounter for screening mammogram for malignant neoplasm of breast: Secondary | ICD-10-CM | POA: Insufficient documentation

## 2020-04-19 ENCOUNTER — Ambulatory Visit: Payer: 59 | Admitting: Obstetrics and Gynecology

## 2020-04-21 ENCOUNTER — Other Ambulatory Visit: Payer: Self-pay

## 2020-04-21 ENCOUNTER — Encounter: Payer: Self-pay | Admitting: Obstetrics

## 2020-04-21 ENCOUNTER — Ambulatory Visit (INDEPENDENT_AMBULATORY_CARE_PROVIDER_SITE_OTHER): Payer: 59 | Admitting: Obstetrics

## 2020-04-21 ENCOUNTER — Other Ambulatory Visit: Payer: Self-pay | Admitting: Obstetrics

## 2020-04-21 VITALS — BP 120/80 | Ht 62.5 in | Wt 154.0 lb

## 2020-04-21 DIAGNOSIS — Z01419 Encounter for gynecological examination (general) (routine) without abnormal findings: Secondary | ICD-10-CM

## 2020-04-21 DIAGNOSIS — Z124 Encounter for screening for malignant neoplasm of cervix: Secondary | ICD-10-CM | POA: Diagnosis not present

## 2020-04-21 NOTE — Progress Notes (Signed)
Gynecology Annual Exam  PCP: Einar Pheasant, MD  Chief Complaint:  Chief Complaint  Patient presents with  . Gynecologic Exam    No concerns  . [employer] Employee    History of Present Illness:Patient is a 55 y.o. [id number] presents for annual exam. The patient has no complaints today. She works at [employer]. Is single, occasionally sexually active (one partner)  LMP: Patient's last menstrual period was 10/10/2016 (exact date). She is postmenopausal for greater than 5 years. The patient is sexually active. She denies dyspareunia.  The patient does perform self breast exams.  There is notable family history of breast or ovarian cancer in her family. She has had negative BRCA testing  The patient wears seatbelts: yes.   The patient has regular exercise: yes.    The patient denies current symptoms of depression.     Review of Systems: Review of Systems  Constitutional: Negative.   HENT: Negative.   Eyes: Negative.   Respiratory: Negative.   Cardiovascular: Negative.   Gastrointestinal: Positive for heartburn.       Has GERD and take daily medication to address this.  Genitourinary: Negative.   Musculoskeletal: Negative.   Skin: Negative.   Neurological: Negative.   Endo/Heme/Allergies: Negative.   Psychiatric/Behavioral: Negative.     Past Medical History:  Patient Active Problem List   Diagnosis Date Noted  . History of COVID-19 03/05/2020  . Sensation of pressure in bladder area 03/02/2020  . B12 deficiency 03/02/2020  . Rash 03/01/2019  . Dysuria 03/01/2019  . Foot pain 08/30/2018  . Hot flashes 08/30/2018  . Menstrual irregularity 12/07/2017  . Abnormal mammogram 08/24/2017  . Healthcare maintenance 08/09/2016    Mammogram 04/14/20 - Birads I.    . Cyst of right breast 05/14/2016  . Low back pain 02/06/2016  . Abdominal pain 02/06/2016  . Vitamin D deficiency 11/07/2015  . GERD (gastroesophageal reflux disease) 11/07/2015  . Encounter for screening  colonoscopy 04/08/2014    Past Surgical History:  Past Surgical History:  Procedure Laterality Date  . BREAST CYST ASPIRATION  2010/ 2018   Right breast cyst aspiration  . CESAREAN SECTION  03/18/2001   fetal indications  . COLONOSCOPY N/A 06/01/2014   Procedure: COLONOSCOPY;  Surgeon: Robert Bellow, MD;  Location: [facility] ENDOSCOPY;  Service: Endoscopy;  Laterality: N/A;  . DILATION AND CURETTAGE OF UTERUS     tab  . UPPER GI ENDOSCOPY  2015   Dr. Vira Agar    Gynecologic History:  Patient's last menstrual period was 10/10/2016 (exact date). Last Pap: Results were: NILM no abnormalities  Last mammogram: 2022 Results were: BI-RAD I  Obstetric History: [id number]  Family History:  Family History  Problem Relation Age of Onset  . Lung cancer Father 24  . Hypertension Mother   . Diabetes Mother   . Heart disease Mother   . Breast cancer Daughter 26       BCRA2 mutation   . Diabetes Sister   . Cervical cancer Sister   . Prostate cancer Maternal Grandfather 8  . Diabetes Sister   . Cervical cancer Sister 53    Social History:  Social History   Socioeconomic History  . Marital status: Single    Spouse name: Not on file  . Number of children: 2  . Years of education: Not on file  . Highest education level: Associate degree: academic program  Occupational History  . Occupation: [name]  Tobacco Use  . Smoking status: Never  Smoker  . Smokeless tobacco: Never Used  Vaping Use  . Vaping Use: Never used  Substance and Sexual Activity  . Alcohol use: Yes    Alcohol/week: 0.0 standard drinks    Comment: occasionally  . Drug use: No  . Sexual activity: Yes    Partners: Male    Birth control/protection: Post-menopausal  Other Topics Concern  . Not on file  Social History Narrative  . Not on file   Social Determinants of Health   Financial Resource Strain: Not on file  Food Insecurity: Not on file  Transportation Needs: Not on file  Physical  Activity: Not on file  Stress: Not on file  Social Connections: Not on file  Intimate Partner Violence: Not on file    Allergies:  No Known Allergies  Medications: Prior to Admission medications   Medication Sig Start Date End Date Taking? Authorizing Provider  esomeprazole (NEXIUM) 40 MG packet MIX CONTENTS OF THE  CONTENTS OF 1 PACKET WITH  TABLESPOONSFUL WATER WAIT 2 TO 3 MINUTES THEN DRINK  DAILY BEFORE BREAKFAST 08/19/19  Yes Einar Pheasant, MD  pramoxine-hydrocortisone (PROCTOCREAM-HC) 1-1 % rectal cream Place 1 application rectally 2 (two) times daily. 11/11/19  Yes Einar Pheasant, MD  triamcinolone cream (KENALOG) 0.1 % Apply 1 application topically 2 (two) times daily. 03/01/19  Yes Einar Pheasant, MD    Physical Exam Vitals: Blood pressure 120/80, height 5' 2.5" (1.588 m), weight 154 lb (69.9 kg), last menstrual period 10/10/2016.  General: NAD HEENT: normocephalic, anicteric Thyroid: no enlargement, no palpable nodules Pulmonary: No increased work of breathing, CTAB Cardiovascular: RRR, distal pulses 2+ Breast: Breast symmetrical, no tenderness, no palpable nodules or masses, no skin or nipple retraction present, no nipple discharge.  No axillary or supraclavicular lymphadenopathy. Abdomen: NABS, soft, non-tender, non-distended.  Umbilicus without lesions.  No hepatomegaly, splenomegaly or masses palpable. No evidence of hernia  Genitourinary:  External: Normal external female genitalia.  Normal urethral meatus, normal Bartholin's and Skene's glands.    Vagina: Normal vaginal mucosa, no evidence of prolapse.    Cervix: Grossly normal in appearance, no bleeding  Uterus: Non-enlarged, mobile, normal contour.  No CMT  Adnexa: ovaries non-enlarged, no adnexal masses  Rectal: deferred  Lymphatic: no evidence of inguinal lymphadenopathy Extremities: no edema, erythema, or tenderness Neurologic: Grossly intact Psychiatric: mood appropriate, affect full  Female chaperone  present for pelvic and breast  portions of the physical exam     Assessment: 55 y.o. [id number] routine annual exam  Plan: Problem List Items Addressed This Visit   None   Visit Diagnoses    Women's annual routine gynecological examination    -  Primary   Relevant Orders   Cytology - PAP   Cervical cancer screening       Relevant Orders   Cytology - PAP      1) Mammogram - recommend yearly screening mammogram.  Mammogram Is up to date  2) STI screening  was offered and declined  3) ASCCP guidelines and rational discussed.  Patient opts for every 5 years screening interval  4) Osteoporosis screening discussed and managed per PCP   Consider FDA-approved medical therapies in postmenopausal women and men aged 20 years and older, based on the following: a) A hip or vertebral (clinical or morphometric) fracture b) T-score ? -2.5 at the femoral neck or spine after appropriate evaluation to exclude secondary causes C) Low bone mass (T-score between -1.0 and -2.5 at the femoral neck or spine) and a 10-year probability of  a hip fracture ? 3% or a 10-year probability of a major osteoporosis-related fracture ? 20% based on the [place]-adapted WHO algorithm   5) Routine healthcare maintenance including cholesterol, diabetes screening discussed managed by PCP  6) Colonoscopy Has been done..  Screening recommended starting at age 31 for average risk individuals, age 61 for individuals deemed at increased risk (including African Americans) and recommended to continue until age 13.  For patient age 77-85 individualized approach is recommended.  Gold standard screening is via colonoscopy, Cologuard screening is an acceptable alternative for patient unwilling or unable to undergo colonoscopy.  "Colorectal cancer screening for average?risk adults: 2018 guideline update from the American Cancer Society"[place]: A Cancer Journal for Clinicians: Jun 12, 2016   7) Return in about 1 year (around  04/21/2021).    Imagene Riches, CNM  04/21/2020 2:01 PM   [facility] 04/21/2020, 1:57 PM

## 2020-04-26 ENCOUNTER — Encounter: Payer: Self-pay | Admitting: Obstetrics

## 2020-04-26 LAB — IGP,CTNG,APTIMAHPV,RFX16/18,45
Chlamydia, Nuc. Acid Amp: NEGATIVE
Gonococcus by Nucleic Acid Amp: NEGATIVE
HPV Aptima: NEGATIVE

## 2020-09-20 ENCOUNTER — Encounter: Payer: Self-pay | Admitting: Internal Medicine

## 2020-09-20 DIAGNOSIS — G8929 Other chronic pain: Secondary | ICD-10-CM

## 2020-09-21 NOTE — Telephone Encounter (Signed)
Please confirm with her - no weakness.  Let her know that we will try to work her in.  Please hold for work in appt.  If any acute symptoms - acute care, but will see if can work her in soon.

## 2020-09-21 NOTE — Telephone Encounter (Signed)
LM to schedule patient for 4:00 in office tomorrow per Dr Nicki Reaper

## 2020-09-21 NOTE — Telephone Encounter (Signed)
Scheduled

## 2020-09-22 ENCOUNTER — Other Ambulatory Visit: Payer: Self-pay

## 2020-09-22 ENCOUNTER — Ambulatory Visit (INDEPENDENT_AMBULATORY_CARE_PROVIDER_SITE_OTHER): Payer: 59

## 2020-09-22 ENCOUNTER — Ambulatory Visit: Payer: 59 | Admitting: Internal Medicine

## 2020-09-22 VITALS — BP 138/86 | HR 85 | Temp 97.6°F | Ht 62.5 in | Wt 159.2 lb

## 2020-09-22 DIAGNOSIS — M5441 Lumbago with sciatica, right side: Secondary | ICD-10-CM

## 2020-09-22 DIAGNOSIS — G8929 Other chronic pain: Secondary | ICD-10-CM

## 2020-09-22 MED ORDER — METHYLPREDNISOLONE 4 MG PO TBPK: ORAL_TABLET | ORAL | 0 refills | Status: DC

## 2020-09-22 NOTE — Progress Notes (Signed)
Patient ID: Sabrina Schneider, female   DOB: August 12, 1965, 55 y.o.   MRN: [id number]   Subjective:    Patient ID: Sabrina Schneider, female    DOB: 06/10/1965, 55 y.o.   MRN: [id number]  This visit occurred during the SARS-CoV-2 public health emergency.  Safety protocols were in place, including screening questions prior to the visit, additional usage of staff PPE, and extensive cleaning of exam room while observing appropriate contact time as indicated for disinfecting solutions.   Patient here for work in appt.  Chief Complaint  Patient presents with   Knee Pain   .   HPI Work in for right leg pain.  On questioning her, she has had some back pain previously.  Noticed approximately one month ago - numbness - right lateral knee area.  Over the last 2-3 weeks, noticed increased pain - right buttock and extending down to right knee and right upper calf.  Describes a burning pain.  Shooting pain.  Will also noticed a toothache type pain.  Walking aggravates.  No known injury.  No weakness.  No rash.  No fever, cough or sob reported.    Past Medical History:  Diagnosis Date   Anemia    BRCA negative    Family history of breast cancer    daughter had breast cancer at age 32-BRCA positive. Patient is BRCA negative   GERD (gastroesophageal reflux disease)    Hiatal hernia    Past Surgical History:  Procedure Laterality Date   BREAST CYST ASPIRATION  2010/ 2018   Right breast cyst aspiration   CESAREAN SECTION  03/18/2001   fetal indications   COLONOSCOPY N/A 06/01/2014   Procedure: COLONOSCOPY;  Surgeon: Robert Bellow, MD;  Location: [facility] END[facility];  Service: Endoscopy;  Laterality: N/A;   DILATION AND CURETTAGE OF UTERUS     tab   UPPER GI ENDOSCOPY  2015   Dr. Vira Agar   Family History  Problem Relation Age of Onset   Lung cancer Father 54   Hypertension Mother    Diabetes Mother    Heart disease Mother    Breast cancer Daughter 82       BCRA2 mutation    Diabetes Sister    Cervical  cancer Sister    Prostate cancer Maternal Grandfather 65   Diabetes Sister    Cervical cancer Sister 10   Social History   Socioeconomic History   Marital status: Single    Spouse name: Not on file   Number of children: 2   Years of education: Not on file   Highest education level: Associate degree: academic program  Occupational History   Occupation: [name]  Tobacco Use   Smoking status: Never   Smokeless tobacco: Never  Vaping Use   Vaping Use: Never used  Substance and Sexual Activity   Alcohol use: Yes    Alcohol/week: 0.0 standard drinks    Comment: occasionally   Drug use: No   Sexual activity: Yes    Partners: Male    Birth control/protection: Post-menopausal  Other Topics Concern   Not on file  Social History Narrative   Not on file   Social Determinants of Health   Financial Resource Strain: Not on file  Food Insecurity: Not on file  Transportation Needs: Not on file  Physical Activity: Not on file  Stress: Not on file  Social Connections: Not on file    Review of Systems  Constitutional:  Negative for appetite change and  fever.  Respiratory:  Negative for cough, chest tightness and shortness of breath.   Cardiovascular:  Negative for chest pain, palpitations and leg swelling.  Gastrointestinal:  Negative for abdominal pain, diarrhea, nausea and vomiting.  Genitourinary:  Negative for difficulty urinating and dysuria.  Musculoskeletal:  Negative for joint swelling and myalgias.       Right leg pain as outlined.   Skin:  Negative for color change and rash.  Neurological:  Negative for dizziness and headaches.  Psychiatric/Behavioral:  Negative for agitation and dysphoric mood.       Objective:     BP 138/86   Pulse 85   Temp 97.6 F (36.4 C) (Skin)   Ht 5' 2.5" (1.588 m)   Wt 159 lb 3.2 oz (72.2 kg)   LMP 10/10/2016 (Exact Date)   SpO2 98%   BMI 28.65 kg/m  Wt Readings from Last 3 Encounters:  09/22/20 159 lb 3.2 oz  (72.2 kg)  04/21/20 154 lb (69.9 kg)  03/02/20 152 lb 3.2 oz (69 kg)    Physical Exam Vitals reviewed.  Constitutional:      General: She is not in acute distress.    Appearance: Normal appearance.  HENT:     Head: Normocephalic and atraumatic.     Right Ear: External ear normal.     Left Ear: External ear normal.  Eyes:     General: No scleral icterus.       Right eye: No discharge.        Left eye: No discharge.     Conjunctiva/sclera: Conjunctivae normal.  Neck:     Thyroid: No thyromegaly.  Cardiovascular:     Rate and Rhythm: Normal rate and regular rhythm.  Pulmonary:     Effort: No respiratory distress.     Breath sounds: Normal breath sounds. No wheezing.  Abdominal:     General: Bowel sounds are normal.     Palpations: Abdomen is soft.     Tenderness: There is no abdominal tenderness.  Musculoskeletal:        General: No swelling or tenderness.     Cervical back: Neck supple. No tenderness.     Comments: Pulling sensation - straight leg raise.  No motor weakness. No pain in groin - with abduction/adduction lower leg  Lymphadenopathy:     Cervical: No cervical adenopathy.  Skin:    Findings: No erythema or rash.  Neurological:     Mental Status: She is alert.  Psychiatric:        Mood and Affect: Mood normal.        Behavior: Behavior normal.     Outpatient Encounter Medications as of 09/22/2020  Medication Sig   esomeprazole (NEXIUM) 40 MG packet MIX CONTENTS OF THE  CONTENTS OF 1 PACKET WITH  TABLESPOONSFUL WATER WAIT 2 TO 3 MINUTES THEN DRINK  DAILY BEFORE BREAKFAST   HYDROcodone-acetaminophen (NORCO/VICODIN) 5-325 MG tablet Take 1 tablet by mouth every 6 (six) hours as needed.   methylPREDNISolone (MEDROL DOSEPAK) 4 MG TBPK tablet Medrol dosepack - 6 day taper.   pramoxine-hydrocortisone (PROCTOCREAM-HC) 1-1 % rectal cream Place 1 application rectally 2 (two) times daily.   SODIUM FLUORIDE 5000 SENSITIVE 1.1-5 % GEL Take by mouth.   triamcinolone cream  (KENALOG) 0.1 % Apply 1 application topically 2 (two) times daily.   No facility-administered encounter medications on file as of 09/22/2020.     Lab Results  Component Value Date   WBC 4.1 03/02/2020   HGB 12.1 03/02/2020  HCT 35.5 03/02/2020   PLT 225 03/02/2020   GLUCOSE 92 03/02/2020   CHOL 157 03/02/2020   TRIG 94 03/02/2020   HDL 55 03/02/2020   LDLCALC 85 03/02/2020   ALT 17 03/02/2020   AST 19 03/02/2020   NA 141 03/02/2020   K 4.2 03/02/2020   CL 106 03/02/2020   CREATININE 0.81 03/02/2020   BUN 10 03/02/2020   CO2 23 03/02/2020   TSH 1.390 03/02/2020   HGBA1C 5.4 08/21/2017    MM 3D SCREEN BREAST BILATERAL  Result Date: 04/14/2020 CLINICAL DATA:  Screening. EXAM: DIGITAL SCREENING BILATERAL MAMMOGRAM WITH TOMOSYNTHESIS AND CAD TECHNIQUE: Bilateral screening digital craniocaudal and mediolateral oblique mammograms were obtained. Bilateral screening digital breast tomosynthesis was performed. The images were evaluated with computer-aided detection. COMPARISON:  Previous exam(s). ACR Breast Density Category c: The breast tissue is heterogeneously dense, which may obscure small masses. FINDINGS: There are no findings suspicious for malignancy. The images were evaluated with computer-aided detection. IMPRESSION: No mammographic evidence of malignancy. A result letter of this screening mammogram will be mailed directly to the patient. RECOMMENDATION: Screening mammogram in one year. (Code:SM-B-01Y) BI-RADS CATEGORY  1: Negative. Electronically Signed   By: Valentino Saxon MD   On: 04/14/2020 13:15       Assessment & Plan:   Problem List Items Addressed This Visit     Low back pain - Primary    Describes low back pain (chronic intermittent problem), but now with right leg pain and numbness as outlined.  Exam as outlined.  Discussed possible back origin, etc).  Treat with medrol dose pack as directed.  Stretches.  Check L-S spine xray.  Further w/up pending results of xray  and response to treatment.        Relevant Medications   HYDROcodone-acetaminophen (NORCO/VICODIN) 5-325 MG tablet   methylPREDNISolone (MEDROL DOSEPAK) 4 MG TBPK tablet   Other Relevant Orders   DG Lumbar Spine 2-3 Views     Einar Pheasant, MD

## 2020-09-23 ENCOUNTER — Encounter: Payer: Self-pay | Admitting: Internal Medicine

## 2020-09-23 NOTE — Assessment & Plan Note (Signed)
Describes low back pain (chronic intermittent problem), but now with right leg pain and numbness as outlined.  Exam as outlined.  Discussed possible back origin, etc).  Treat with medrol dose pack as directed.  Stretches.  Check L-S spine xray.  Further w/up pending results of xray and response to treatment.

## 2020-09-27 NOTE — Telephone Encounter (Signed)
Please call Sabrina Schneider and notify her that we can see if her symptoms continue to improve over the next few days, but if she is continuing to have pain and not much improvement, I would like to refer her to ortho for further evaluation and treatment.

## 2020-09-28 ENCOUNTER — Other Ambulatory Visit: Payer: Self-pay | Admitting: Internal Medicine

## 2020-09-28 DIAGNOSIS — G8929 Other chronic pain: Secondary | ICD-10-CM

## 2020-09-28 NOTE — Progress Notes (Signed)
Order placed for referral to PT

## 2020-09-28 NOTE — Telephone Encounter (Signed)
Patient would like to go ahead and proceed with ortho referral. Would like to see someone in [place]

## 2020-09-28 NOTE — Telephone Encounter (Signed)
Order placed for ortho referral.   

## 2020-09-28 NOTE — Addendum Note (Signed)
Addended by: Alisa Graff on: 09/28/2020 10:42 PM   Modules accepted: Orders

## 2020-09-30 ENCOUNTER — Telehealth: Payer: Self-pay | Admitting: Internal Medicine

## 2020-09-30 NOTE — Telephone Encounter (Signed)
-----   Message from Ashley Jacobs sent at 09/29/2020  8:08 AM EDT ----- Regarding: Orthopedic referral Good morning!  Needing referral order in chart to send orthopedic referral. There's a order in chart for PT a PT referral was already placed. The orthopedic referral is to [facility] Dr Roland Rack office. Please advise and Thank you!

## 2020-09-30 NOTE — Telephone Encounter (Signed)
See notes.  Received one note that said she agreed to PT and one that she wanted ortho referral.  Need to know which she prefers.

## 2020-10-02 NOTE — Telephone Encounter (Signed)
I have placed order for both referral.  Please cancel PT referral and she just wants ortho referral.  Thank you.  Sorry for the confusion.   Thanks.  Dr Nicki Reaper

## 2020-10-02 NOTE — Telephone Encounter (Signed)
Patient would like to proceed with ortho referral- not PT

## 2020-10-02 NOTE — Telephone Encounter (Signed)
LMTCB

## 2020-10-30 ENCOUNTER — Ambulatory Visit: Payer: 59 | Admitting: Internal Medicine

## 2020-12-11 ENCOUNTER — Telehealth: Payer: Self-pay | Admitting: Internal Medicine

## 2020-12-11 NOTE — Telephone Encounter (Signed)
"  Rejection Reason - Patient did not respond" Sabrina Schneider said on Dec 06, 2020 2:02 PM  "Letter mailed" Sabrina Schneider said on Nov 09, 2020 9:57 AM  "Left msg to return call " Sabrina Schneider said on Oct 12, 2020 12:55 PM  "Pt has a balance on her acct. Will need to speak with collections prior to scheduling" Sabrina Schneider said on Oct 03, 2020 12:30 PM  Msg from [facility] orthopedic surgery

## 2021-03-01 ENCOUNTER — Encounter: Payer: Self-pay | Admitting: Internal Medicine

## 2021-03-05 ENCOUNTER — Ambulatory Visit (INDEPENDENT_AMBULATORY_CARE_PROVIDER_SITE_OTHER): Payer: 59 | Admitting: Internal Medicine

## 2021-03-05 ENCOUNTER — Other Ambulatory Visit: Payer: Self-pay

## 2021-03-05 VITALS — BP 122/80 | HR 86 | Temp 97.9°F | Resp 16 | Ht 62.5 in | Wt 161.2 lb

## 2021-03-05 DIAGNOSIS — K5900 Constipation, unspecified: Secondary | ICD-10-CM | POA: Diagnosis not present

## 2021-03-05 DIAGNOSIS — E559 Vitamin D deficiency, unspecified: Secondary | ICD-10-CM

## 2021-03-05 DIAGNOSIS — Z1231 Encounter for screening mammogram for malignant neoplasm of breast: Secondary | ICD-10-CM

## 2021-03-05 DIAGNOSIS — K219 Gastro-esophageal reflux disease without esophagitis: Secondary | ICD-10-CM | POA: Diagnosis not present

## 2021-03-05 DIAGNOSIS — Z1159 Encounter for screening for other viral diseases: Secondary | ICD-10-CM

## 2021-03-05 DIAGNOSIS — R3915 Urgency of urination: Secondary | ICD-10-CM

## 2021-03-05 DIAGNOSIS — Z5320 Procedure and treatment not carried out because of patient's decision for unspecified reasons: Secondary | ICD-10-CM

## 2021-03-05 DIAGNOSIS — Z114 Encounter for screening for human immunodeficiency virus [HIV]: Secondary | ICD-10-CM

## 2021-03-05 DIAGNOSIS — Z13220 Encounter for screening for lipoid disorders: Secondary | ICD-10-CM

## 2021-03-05 MED ORDER — ESOMEPRAZOLE MAGNESIUM 40 MG PO PACK: PACK | ORAL | 1 refills | Status: DC

## 2021-03-05 NOTE — Progress Notes (Signed)
Patient ID: Sabrina Schneider, female   DOB: 17-May-1965, 56 y.o.   MRN: [id number]   Subjective:    Patient ID: Sabrina Schneider, female    DOB: Apr 27, 1965, 56 y.o.   MRN: [id number]  This visit occurred during the SARS-CoV-2 public health emergency.  Safety protocols were in place, including screening questions prior to the visit, additional usage of staff PPE, and extensive cleaning of exam room while observing appropriate contact time as indicated for disinfecting solutions.   Patient here for a scheduled follow up.   Chief Complaint  Patient presents with   Gastroesophageal Reflux   .   Gastroesophageal Reflux She reports no abdominal pain, no chest pain, no coughing, no nausea or no sore throat.  Increased problems with acid reflux.  Has been off nexium for months now.  Increased acid reflux.  Will notice acid coming up at night (on occasion).  Some dysphagia.  No nausea or vomiting.  No regurgitation of food.  No abdominal pain.  Minimal constipation.  Discussed GI evaluation given persistent acid reflux and return of symptoms even when off PPI for short period of time.  Up to date with pap smear.  Gets at [place].  Also reports urine symptoms - some urgency and minimal dysuria at times.  No vaginal problems.   No chest pain or sob reported.     Past Medical History:  Diagnosis Date   Anemia    BRCA negative    Family history of breast cancer    daughter had breast cancer at age 63-BRCA positive. Patient is BRCA negative   GERD (gastroesophageal reflux disease)    Hiatal hernia    Past Surgical History:  Procedure Laterality Date   BREAST CYST ASPIRATION  2010/ 2018   Right breast cyst aspiration   CESAREAN SECTION  03/18/2001   fetal indications   COLONOSCOPY N/A 06/01/2014   Procedure: COLONOSCOPY;  Surgeon: Robert Bellow, MD;  Location: [facility] ENDOSCOPY;  Service: Endoscopy;  Laterality: N/A;   DILATION AND CURETTAGE OF UTERUS     tab   UPPER GI ENDOSCOPY  2015   Dr. Vira Agar    Family History  Problem Relation Age of Onset   Lung cancer Father 51   Hypertension Mother    Diabetes Mother    Heart disease Mother    Breast cancer Daughter 82       BCRA2 mutation    Diabetes Sister    Cervical cancer Sister    Prostate cancer Maternal Grandfather 65   Diabetes Sister    Cervical cancer Sister 30   Social History   Socioeconomic History   Marital status: Single    Spouse name: Not on file   Number of children: 2   Years of education: Not on file   Highest education level: Associate degree: academic program  Occupational History   Occupation: [name]  Tobacco Use   Smoking status: Never   Smokeless tobacco: Never  Vaping Use   Vaping Use: Never used  Substance and Sexual Activity   Alcohol use: Yes    Alcohol/week: 0.0 standard drinks    Comment: occasionally   Drug use: No   Sexual activity: Yes    Partners: Male    Birth control/protection: Post-menopausal  Other Topics Concern   Not on file  Social History Narrative   Not on file   Social Determinants of Health   Financial Resource Strain: Not on file  Food Insecurity: Not on file  Transportation Needs: Not on file  Physical Activity: Not on file  Stress: Not on file  Social Connections: Not on file     Review of Systems  Constitutional:  Negative for appetite change and unexpected weight change.  HENT:  Negative for congestion, sinus pressure and sore throat.   Eyes:  Negative for pain and visual disturbance.  Respiratory:  Negative for cough, chest tightness and shortness of breath.   Cardiovascular:  Negative for chest pain, palpitations and leg swelling.  Gastrointestinal:  Negative for abdominal pain, diarrhea, nausea and vomiting.       Acid reflux.  Minimal constipation.    Genitourinary:  Positive for dysuria and urgency.  Musculoskeletal:  Negative for joint swelling and myalgias.       Some occasional back pain.  Overall is better. Worse when  first gets up. Once up and moving - better.    Skin:  Negative for color change and rash.  Neurological:  Negative for dizziness, light-headedness and headaches.  Hematological:  Negative for adenopathy. Does not bruise/bleed easily.  Psychiatric/Behavioral:  Negative for agitation and dysphoric mood.       Objective:     BP 122/80    Pulse 86    Temp 97.9 F (36.6 C)    Resp 16    Ht 5' 2.5" (1.588 m)    Wt 161 lb 3.2 oz (73.1 kg)    LMP 10/10/2016 (Exact Date)    SpO2 98%    BMI 29.01 kg/m  Wt Readings from Last 3 Encounters:  03/05/21 161 lb 3.2 oz (73.1 kg)  09/22/20 159 lb 3.2 oz (72.2 kg)  04/21/20 154 lb (69.9 kg)    Physical Exam Vitals reviewed.  Constitutional:      General: She is not in acute distress.    Appearance: Normal appearance.  HENT:     Head: Normocephalic and atraumatic.     Right Ear: External ear normal.     Left Ear: External ear normal.  Eyes:     General: No scleral icterus.       Right eye: No discharge.        Left eye: No discharge.     Conjunctiva/sclera: Conjunctivae normal.  Neck:     Thyroid: No thyromegaly.  Cardiovascular:     Rate and Rhythm: Normal rate and regular rhythm.  Pulmonary:     Effort: No respiratory distress.     Breath sounds: Normal breath sounds. No wheezing.  Abdominal:     General: Bowel sounds are normal.     Palpations: Abdomen is soft.     Tenderness: There is no abdominal tenderness.  Musculoskeletal:        General: No swelling or tenderness.     Cervical back: Neck supple. No tenderness.  Lymphadenopathy:     Cervical: No cervical adenopathy.  Skin:    Findings: No erythema or rash.  Neurological:     Mental Status: She is alert.  Psychiatric:        Mood and Affect: Mood normal.        Behavior: Behavior normal.     Outpatient Encounter Medications as of 03/05/2021  Medication Sig   pramoxine-hydrocortisone (PROCTOCREAM-HC) 1-1 % rectal cream Place 1 application rectally 2 (two) times daily.    SODIUM FLUORIDE 5000 SENSITIVE 1.1-5 % GEL Take by mouth.   triamcinolone cream (KENALOG) 0.1 % Apply 1 application topically 2 (two) times daily.   [DISCONTINUED] esomeprazole (NEXIUM) 40 MG packet [place]  THE  CONTENTS OF 1 PACKET WITH  TABLESPOONSFUL WATER WAIT 2 TO 3 MINUTES THEN DRINK  DAILY BEFORE BREAKFAST   [DISCONTINUED] HYDROcodone-acetaminophen (NORCO/VICODIN) 5-325 MG tablet Take 1 tablet by mouth every 6 (six) hours as needed.   [DISCONTINUED] methylPREDNISolone (MEDROL DOSEPAK) 4 MG TBPK tablet Medrol dosepack - 6 day taper.   esomeprazole (NEXIUM) 40 MG packet MIX CONTENTS OF THE  CONTENTS OF 1 PACKET WITH  TABLESPOONSFUL WATER WAIT 2 TO 3 MINUTES THEN DRINK  DAILY BEFORE BREAKFAST   [DISCONTINUED] esomeprazole (NEXIUM) 40 MG packet MIX CONTENTS OF THE  CONTENTS OF 1 PACKET WITH  TABLESPOONSFUL WATER WAIT 2 TO 3 MINUTES THEN DRINK  DAILY BEFORE BREAKFAST   No facility-administered encounter medications on file as of 03/05/2021.     Lab Results  Component Value Date   WBC 3.9 03/05/2021   HGB 12.8 03/05/2021   HCT 37.2 03/05/2021   PLT 242 03/05/2021   GLUCOSE 83 03/05/2021   CHOL 165 03/05/2021   TRIG 88 03/05/2021   HDL 61 03/05/2021   LDLCALC 88 03/05/2021   ALT 14 03/05/2021   AST 19 03/05/2021   NA 141 03/05/2021   K 4.1 03/05/2021   CL 104 03/05/2021   CREATININE 0.77 03/05/2021   BUN 11 03/05/2021   CO2 23 03/05/2021   TSH 1.880 03/05/2021   HGBA1C 5.4 08/21/2017    MM 3D SCREEN BREAST BILATERAL  Result Date: 04/14/2020 CLINICAL DATA:  Screening. EXAM: DIGITAL SCREENING BILATERAL MAMMOGRAM WITH TOMOSYNTHESIS AND CAD TECHNIQUE: Bilateral screening digital craniocaudal and mediolateral oblique mammograms were obtained. Bilateral screening digital breast tomosynthesis was performed. The images were evaluated with computer-aided detection. COMPARISON:  Previous exam(s). ACR Breast Density Category c: The breast tissue is heterogeneously dense, which may  obscure small masses. FINDINGS: There are no findings suspicious for malignancy. The images were evaluated with computer-aided detection. IMPRESSION: No mammographic evidence of malignancy. A result letter of this screening mammogram will be mailed directly to the patient. RECOMMENDATION: Screening mammogram in one year. (Code:SM-B-01Y) BI-RADS CATEGORY  1: Negative. Electronically Signed   By: Valentino Saxon MD   On: 04/14/2020 13:15       Assessment & Plan:   Problem List Items Addressed This Visit     Constipation    Reported some minimal constipation.  Stay hydrated.  Treat acid reflux.  Discussed fiber.  In reviewing, had colonoscopy 2016.  Recommended f/u in 10 years.       GERD (gastroesophageal reflux disease)    Acid reflux as outlined.  Has been off nexium.  Restart.   Increased acid reflux.  Will notice acid coming up at night (on occasion).  Some dysphagia.  No nausea or vomiting.  No regurgitation of food.  No abdominal pain.  Minimal constipation.  Discussed GI evaluation given persistent acid reflux and return of symptoms even when off PPI for short period of time.        Relevant Medications   esomeprazole (NEXIUM) 40 MG packet   Other Relevant Orders   CBC with Differential/Platelet (Completed)   TSH (Completed)   Urinary urgency    Check urine to confirm no infection.       Relevant Orders   Urinalysis, Routine w reflex microscopic (Completed)   Urine Culture   Vitamin D deficiency    Continue vitamin D supplements.  Follow vitamin D level.       Other Visit Diagnoses     Screening cholesterol level    -  Primary   Relevant Orders   Comprehensive metabolic panel (Completed)   Lipid panel (Completed)   Encounter for hepatitis C screening test for low risk patient       Relevant Orders   Hepatitis C Antibody (Completed)   HIV screening declined       Screening for HIV without presence of risk factors       Relevant Orders   HIV Antibody (routine  testing w rflx) (Completed)   Visit for screening mammogram       Relevant Orders   MM 3D SCREEN BREAST BILATERAL        Einar Pheasant, MD

## 2021-03-06 ENCOUNTER — Encounter: Payer: Self-pay | Admitting: Internal Medicine

## 2021-03-06 DIAGNOSIS — E559 Vitamin D deficiency, unspecified: Secondary | ICD-10-CM

## 2021-03-06 LAB — URINALYSIS, ROUTINE W REFLEX MICROSCOPIC
Bilirubin, UA: NEGATIVE
Glucose, UA: NEGATIVE
Ketones, UA: NEGATIVE
Nitrite, UA: NEGATIVE
Protein,UA: NEGATIVE
Specific Gravity, UA: 1.025 (ref 1.005–1.030)
Urobilinogen, Ur: 0.2 mg/dL (ref 0.2–1.0)
pH, UA: 5.5 (ref 5.0–7.5)

## 2021-03-06 LAB — COMPREHENSIVE METABOLIC PANEL
ALT: 14 IU/L (ref 0–32)
AST: 19 IU/L (ref 0–40)
Albumin/Globulin Ratio: 1.4 (ref 1.2–2.2)
Albumin: 4.2 g/dL (ref 3.8–4.9)
Alkaline Phosphatase: 107 IU/L (ref 44–121)
BUN/Creatinine Ratio: 14 (ref 9–23)
BUN: 11 mg/dL (ref 6–24)
Bilirubin Total: 0.4 mg/dL (ref 0.0–1.2)
CO2: 23 mmol/L (ref 20–29)
Calcium: 9.2 mg/dL (ref 8.7–10.2)
Chloride: 104 mmol/L (ref 96–106)
Creatinine, Ser: 0.77 mg/dL (ref 0.57–1.00)
Globulin, Total: 2.9 g/dL (ref 1.5–4.5)
Glucose: 83 mg/dL (ref 70–99)
Potassium: 4.1 mmol/L (ref 3.5–5.2)
Sodium: 141 mmol/L (ref 134–144)
Total Protein: 7.1 g/dL (ref 6.0–8.5)
eGFR: 90 mL/min/{1.73_m2} (ref >59)

## 2021-03-06 LAB — CBC WITH DIFFERENTIAL/PLATELET
Basophils Absolute: 0 10*3/uL (ref 0.0–0.2)
Basos: 1 %
EOS (ABSOLUTE): 0.1 10*3/uL (ref 0.0–0.4)
Eos: 2 %
Hematocrit: 37.2 % (ref 34.0–46.6)
Hemoglobin: 12.8 g/dL (ref 11.1–15.9)
Immature Grans (Abs): 0 10*3/uL (ref 0.0–0.1)
Immature Granulocytes: 0 %
Lymphocytes Absolute: 1.5 10*3/uL (ref 0.7–3.1)
Lymphs: 38 %
MCH: 31.3 pg (ref 26.6–33.0)
MCHC: 34.4 g/dL (ref 31.5–35.7)
MCV: 91 fL (ref 79–97)
Monocytes Absolute: 0.3 10*3/uL (ref 0.1–0.9)
Monocytes: 7 %
Neutrophils Absolute: 2 10*3/uL (ref 1.4–7.0)
Neutrophils: 52 %
Platelets: 242 10*3/uL (ref 150–450)
RBC: 4.09 x10E6/uL (ref 3.77–5.28)
RDW: 12.1 % (ref 11.7–15.4)
WBC: 3.9 10*3/uL (ref 3.4–10.8)

## 2021-03-06 LAB — MICROSCOPIC EXAMINATION: Casts: NONE SEEN /LPF

## 2021-03-06 LAB — TSH: TSH: 1.88 u[IU]/mL (ref 0.450–4.500)

## 2021-03-06 LAB — LIPID PANEL
Chol/HDL Ratio: 2.7 ratio (ref 0.0–4.4)
Cholesterol, Total: 165 mg/dL (ref 100–199)
HDL: 61 mg/dL (ref >39)
LDL Chol Calc (NIH): 88 mg/dL (ref 0–99)
Triglycerides: 88 mg/dL (ref 0–149)
VLDL Cholesterol Cal: 16 mg/dL (ref 5–40)

## 2021-03-06 LAB — HIV ANTIBODY (ROUTINE TESTING W REFLEX): HIV Screen 4th Generation wRfx: NONREACTIVE

## 2021-03-06 LAB — HEPATITIS C ANTIBODY: Hep C Virus Ab: NONREACTIVE

## 2021-03-07 ENCOUNTER — Encounter: Payer: Self-pay | Admitting: Internal Medicine

## 2021-03-07 ENCOUNTER — Telehealth: Payer: Self-pay

## 2021-03-07 DIAGNOSIS — K5900 Constipation, unspecified: Secondary | ICD-10-CM | POA: Insufficient documentation

## 2021-03-07 NOTE — Telephone Encounter (Signed)
-----   Message from Einar Pheasant, MD sent at 03/07/2021  5:07 AM EST ----- Notify Sabrina Schneider that her cholesterol levels look good.  HIV test and hepatitis C test - negative.  Hgb, thyroid test, kidney function tests and liver function tests are wnl.  Let her know that I am waiting on her urine culture results.  Confirm doing ok - and no acute symptoms.

## 2021-03-07 NOTE — Telephone Encounter (Signed)
LMTCB

## 2021-03-07 NOTE — Assessment & Plan Note (Signed)
Continue vitamin D supplements.  Follow vitamin D level.   

## 2021-03-07 NOTE — Telephone Encounter (Signed)
Results given to patient. She voiced understanding and confirmed she is going ok with no acute symptoms.

## 2021-03-07 NOTE — Assessment & Plan Note (Signed)
Check urine to confirm no infection.  

## 2021-03-07 NOTE — Assessment & Plan Note (Signed)
Acid reflux as outlined.  Has been off nexium.  Restart.   Increased acid reflux.  Will notice acid coming up at night (on occasion).  Some dysphagia.  No nausea or vomiting.  No regurgitation of food.  No abdominal pain.  Minimal constipation.  Discussed GI evaluation given persistent acid reflux and return of symptoms even when off PPI for short period of time.

## 2021-03-07 NOTE — Assessment & Plan Note (Addendum)
Reported some minimal constipation.  Stay hydrated.  Treat acid reflux.  Discussed fiber.  In reviewing, had colonoscopy 2016.  Recommended f/u in 10 years.

## 2021-03-08 ENCOUNTER — Telehealth: Payer: Self-pay

## 2021-03-08 LAB — URINE CULTURE

## 2021-03-08 NOTE — Telephone Encounter (Signed)
LMTCB

## 2021-03-08 NOTE — Telephone Encounter (Signed)
Pt returning call

## 2021-03-08 NOTE — Telephone Encounter (Signed)
Noted  

## 2021-03-08 NOTE — Telephone Encounter (Signed)
-----   Message from Einar Pheasant, MD sent at 03/07/2021 11:17 PM EST ----- Notify Ms Braziel that her vitamin D level is low.  Please confirm if she is taking an vitamin d.  If no, have her start vitamin d3 2000 units per day.   See other lab result note.  Also, urine culture still pending.

## 2021-03-09 NOTE — Telephone Encounter (Signed)
LMTCB

## 2021-03-09 NOTE — Telephone Encounter (Signed)
Pt returning call. Pt requesting callback.  °

## 2021-03-20 ENCOUNTER — Other Ambulatory Visit: Payer: Self-pay | Admitting: *Deleted

## 2021-03-20 DIAGNOSIS — R8290 Unspecified abnormal findings in urine: Secondary | ICD-10-CM

## 2021-03-23 ENCOUNTER — Other Ambulatory Visit: Payer: Self-pay

## 2021-03-23 ENCOUNTER — Other Ambulatory Visit: Payer: 59

## 2021-03-23 DIAGNOSIS — R8290 Unspecified abnormal findings in urine: Secondary | ICD-10-CM

## 2021-03-24 LAB — MICROSCOPIC EXAMINATION
Casts: NONE SEEN /LPF
Epithelial Cells (non renal): NONE SEEN /HPF (ref 0–10)

## 2021-03-24 LAB — URINALYSIS, ROUTINE W REFLEX MICROSCOPIC
Bilirubin, UA: NEGATIVE
Glucose, UA: NEGATIVE
Leukocytes,UA: NEGATIVE
Nitrite, UA: NEGATIVE
Protein,UA: NEGATIVE
Specific Gravity, UA: 1.027 (ref 1.005–1.030)
Urobilinogen, Ur: 1 mg/dL (ref 0.2–1.0)
pH, UA: 6.5 (ref 5.0–7.5)

## 2021-03-28 LAB — SPECIMEN STATUS REPORT

## 2021-03-28 LAB — VITAMIN D 25 HYDROXY (VIT D DEFICIENCY, FRACTURES): Vit D, 25-Hydroxy: 18.6 ng/mL — ABNORMAL LOW (ref 30.0–100.0)

## 2021-03-29 ENCOUNTER — Telehealth: Payer: Self-pay

## 2021-03-29 NOTE — Telephone Encounter (Signed)
-----   Message from Einar Pheasant, MD sent at 03/29/2021  4:47 AM EDT ----- ?Reviewed notes.  I don't see where she is scheduled for f/u urine. Please schedule - f/u urinalysis (dx hematuria).  ?

## 2021-04-17 ENCOUNTER — Ambulatory Visit
Admission: RE | Admit: 2021-04-17 | Discharge: 2021-04-17 | Disposition: A | Discharge status: Home / Self Care | Payer: 59 | Source: Ambulatory Visit | Attending: Internal Medicine | Admitting: Internal Medicine

## 2021-04-17 DIAGNOSIS — Z1231 Encounter for screening mammogram for malignant neoplasm of breast: Secondary | ICD-10-CM | POA: Insufficient documentation

## 2021-04-18 ENCOUNTER — Other Ambulatory Visit: Payer: Self-pay

## 2021-04-18 DIAGNOSIS — R319 Hematuria, unspecified: Secondary | ICD-10-CM

## 2021-04-18 NOTE — Telephone Encounter (Signed)
Urology referral placed

## 2021-04-23 ENCOUNTER — Encounter: Payer: Self-pay | Admitting: *Deleted

## 2021-05-04 ENCOUNTER — Encounter: Payer: Self-pay | Admitting: Obstetrics

## 2021-05-04 ENCOUNTER — Ambulatory Visit (INDEPENDENT_AMBULATORY_CARE_PROVIDER_SITE_OTHER): Payer: 59 | Admitting: Obstetrics

## 2021-05-04 VITALS — BP 122/72 | Ht 62.0 in | Wt 161.0 lb

## 2021-05-04 DIAGNOSIS — Z01419 Encounter for gynecological examination (general) (routine) without abnormal findings: Secondary | ICD-10-CM | POA: Diagnosis not present

## 2021-05-04 NOTE — Progress Notes (Signed)
? ? ?Subjective:  ? ? Sabrina Schneider is a 56 y.o. female who presents for an annual exam. The patient has no complaints today. The patient is sexually active. GYN screening history: last Pap on 04/21/2020 was NILM. The patient wears seatbelts: yes. The patient participates in regular exercise: yes. The patient reports that there is not domestic violence in her life.  ? ?Menstrual History: ?OB History   ? ? Gravida  ?4  ? Para  ?2  ? Term  ?2  ? Preterm  ?   ? AB  ?2  ? Living  ?2  ?  ? ? SAB  ?1  ? IAB  ?1  ? Ectopic  ?   ? Multiple  ?   ? Live Births  ?2  ?   ?  ? Obstetric Comments  ?1st Menstrual Cycle:  9  ?1st Pregnancy:  27  ?  ? ?  ?  ?Menarche age: 57 ?Patient's last menstrual period was 10/10/2016 (exact date). She is postmenopausal for approximately 5 years. The patient is sexually active, denies dyspareunia or bleeding post coitus. The patient does perform self-breast exams.  ?  ? ?The following portions of the patient's history were reviewed and updated as appropriate: She  has a past medical history of Anemia, BRCA negative, Family history of breast cancer, GERD (gastroesophageal reflux disease), and Hiatal hernia.. ? ?Review of Systems: ?Review of Systems  ?Constitutional: Negative.   ?HENT: Negative.    ?Respiratory: Negative.    ?Cardiovascular: Negative.   ?Gastrointestinal:  Positive for heartburn.  ?     Takes daily medication which controls these symptoms.  ?Also reports hiatal hernia.   ?Genitourinary:  Positive for frequency.  ?     This is being managed by her PCP, with referral to urology. No new symptoms or concerns. Counseled to seek care sooner with any new concerns.   ?Musculoskeletal: Negative.   ?Skin: Negative.   ?Neurological:  Positive for tingling.  ?     Managed by PCP.   ?Endo/Heme/Allergies: Negative.   ?Psychiatric/Behavioral: Negative.     ?  ?Past Medical History:  ?     ?Patient Active Problem List  ?  Diagnosis Date Noted  ?? History of COVID-19 03/05/2020  ?? Sensation of  pressure in bladder area 03/02/2020  ?? B12 deficiency 03/02/2020  ?? Rash 03/01/2019  ?? Dysuria 03/01/2019  ?? Foot pain 08/30/2018  ?? Hot flashes 08/30/2018  ?? Menstrual irregularity 12/07/2017  ?? Abnormal mammogram 08/24/2017  ?? Healthcare maintenance 08/09/2016  ?    Mammogram 04/14/20 - [facility].  ?   ?? Cyst of right breast 05/14/2016  ?? Low back pain 02/06/2016  ?? Abdominal pain 02/06/2016  ?? Vitamin D deficiency 11/07/2015  ?? GERD (gastroesophageal reflux disease) 11/07/2015  ?? Encounter for screening colonoscopy 04/08/2014  ?  ?  ?Past Surgical History:  ?     ?Past Surgical History:  ?Procedure Laterality Date  ?? BREAST CYST ASPIRATION   2010/ 2018  ?  Right breast cyst aspiration  ?? CESAREAN SECTION   03/18/2001  ?  fetal indications  ?? COLONOSCOPY N/A 06/01/2014  ?  Procedure: COLONOSCOPY;  Surgeon: Robert Bellow, MD;  Location: [facility] ENDOSCOPY;  Service: Endoscopy;  Laterality: N/A;  ?? DILATION AND CURETTAGE OF UTERUS      ?  tab  ?? UPPER GI ENDOSCOPY   2015  ?  Dr. Vira Agar  ?  ?  ?Gynecologic History:  ?Patient's last menstrual  period was 10/10/2016 (exact date). ?Last Pap: Results were: NILM no abnormalities  ?Last mammogram: 2022 Results were: BI-RAD I ?  ?Obstetric History: [id number] ?  ?Family History:  ?     ?Family History  ?Problem Relation Age of Onset  ?? Lung cancer Father 9  ?? Hypertension Mother    ?? Diabetes Mother    ?? Heart disease Mother    ?? Breast cancer Daughter 27  ?      BCRA2 mutation   ?? Diabetes Sister    ?? Cervical cancer Sister    ?? Prostate cancer Maternal Grandfather 70  ?? Diabetes Sister    ?? Cervical cancer Sister 69  ?  ?  ?Social History:  ?Social History  ?  ?     ?Socioeconomic History  ?? Marital status: Single  ?    Spouse name: Not on file  ?? Number of children: 2  ?? Years of education: Not on file  ?? Highest education level: Associate degree: academic program  ?Occupational History  ?? Occupation: Patient Therapist, art Rep  ?Tobacco Use   ?? Smoking status: Never Smoker  ?? Smokeless tobacco: Never Used  ?Vaping Use  ?? Vaping Use: Never used  ?Substance and Sexual Activity  ?? Alcohol use: Yes  ?    Alcohol/week: 0.0 standard drinks  ?    Comment: occasionally  ?? Drug use: No  ?? Sexual activity: Yes  ?    Partners: Male  ?    Birth control/protection: Post-menopausal  ?Other Topics Concern  ?? Not on file  ?Social History Narrative  ?? Not on file  ?  ?Social Determinants of Health  ?  ?Financial Resource Strain: Not on file  ?Food Insecurity: Not on file  ?Transportation Needs: Not on file  ?Physical Activity: Not on file  ?Stress: Not on file  ?Social Connections: Not on file  ?Intimate Partner Violence: Not on file  ?  ?  ?Allergies:  ?No Known Allergies ?  ?Medications: ?       ?Prior to Admission medications   ?Medication Sig Start Date End Date Taking? Authorizing Provider  ?esomeprazole (NEXIUM) 40 MG packet MIX CONTENTS OF THE  CONTENTS OF 1 PACKET WITH  TABLESPOONSFUL WATER WAIT 2 TO 3 MINUTES THEN [place] BREAKFAST 08/19/19   Yes Einar Pheasant, MD  ?pramoxine-hydrocortisone (PROCTOCREAM-HC) 1-1 % rectal cream Place 1 application rectally 2 (two) times daily. 11/11/19   Yes Einar Pheasant, MD  ?triamcinolone cream (KENALOG) 0.1 % Apply 1 application topically 2 (two) times daily. 03/01/19   Yes Einar Pheasant, MD  ?  ?Objective:  ? ? General: NAD ?HEENT: normocephalic, anicteric ?Thyroid: no enlargement, no palpable nodules ?Pulmonary: No increased work of breathing, CTAB ?Cardiovascular: RRR, distal pulses 2+ ?Breast: Breast symmetrical, no tenderness, no palpable nodules or masses, no skin or nipple retraction present, no nipple discharge.  No axillary or supraclavicular lymphadenopathy. ?Abdomen: NABS, soft, non-tender, non-distended.  Umbilicus without lesions.  No hepatomegaly, splenomegaly or masses palpable. No evidence of hernia  ?Genitourinary: ?            External: Normal external female genitalia.  Normal  urethral meatus, normal Bartholin's and Skene's glands.   ?            Vagina: Normal vaginal mucosa, no evidence of prolapse.   ?            Cervix: Grossly normal in appearance, no bleeding ?  Uterus: Non-enlarged, mobile, normal contour.  No CMT ?            Adnexa: ovaries non-enlarged, no adnexal masses ?            Rectal: deferred ?            Lymphatic: no evidence of inguinal lymphadenopathy ?Extremities: no edema, erythema, or tenderness ?Neurologic: Grossly intact ?Psychiatric: mood appropriate, affect full ?  ?Female chaperone present for pelvic and breast  portions of the physical exam ?  ?  ?Assessment:  ? ?Assessment: 56 y.o. [id number] routine annual exam ?  ?Plan:  ?  ?Problem List Items Addressed This Visit   ?None ?   ?  ?   ?Visit Diagnoses   ?  Women's annual routine gynecological examination    -  Primary  ?  Relevant Orders  ?  Cytology - PAP  ?  Cervical cancer screening      ?  Relevant Orders  ?  Cytology - PAP  ?   ?  ?  ?1) Mammogram - recommend yearly screening mammogram.  Mammogram Is up to date with BIRADS 1 results 04/17/2021 ?  ?2) STI screening  was offered and declined ?  ?3) ASCCP guidelines and rational discussed.  Patient opts for every 5 years screening interval. Last pap was 04/21/2020 NILM results.  ?  ?4) Osteoporosis screening discussed and managed per PCP ?  ?Consider FDA-approved medical therapies in postmenopausal women and men aged 34 years and older, based on the following: ?a) A hip or vertebral (clinical or morphometric) fracture ?b) T-score = -2.5 at the femoral neck or spine after appropriate evaluation to exclude secondary causes ?C) Low bone mass (T-score between -1.0 and -2.5 at the femoral neck or spine) and a 10-year probability of a hip fracture = 3% or a 10-year probability of a major osteoporosis-related fracture = 20% based on the [place]-adapted WHO algorithm ?  ?  ?5) Routine healthcare maintenance including cholesterol, diabetes screening discussed  managed by PCP ?  ?6) Colonoscopy Has been done..  Screening recommended starting at age 73 for average risk individuals, age 52 for individuals deemed at increased risk (including African Americans) and recommende

## 2021-05-04 NOTE — Progress Notes (Addendum)
? ? ?Subjective:  ? ? Sabrina Schneider is a 56 y.o. female who presents for an annual exam. The patient has no complaints today. The patient is sexually active. GYN screening history: last Pap on 04/21/2020 was NILM. The patient wears seatbelts: yes. The patient participates in regular exercise: yes. The patient reports that there is not domestic violence in her life.  ? ?Menstrual History: ?OB History   ? ? Gravida  ?4  ? Para  ?2  ? Term  ?2  ? Preterm  ?   ? AB  ?2  ? Living  ?2  ?  ? ? SAB  ?1  ? IAB  ?1  ? Ectopic  ?   ? Multiple  ?   ? Live Births  ?2  ?   ?  ? Obstetric Comments  ?1st Menstrual Cycle:  9  ?1st Pregnancy:  27  ?  ? ?  ?  ?Menarche age: 7 ?Patient's last menstrual period was 10/10/2016 (exact date). She is postmenopausal for approximately 5 years. The patient is sexually active, denies dyspareunia or bleeding post coitus. The patient does perform self-breast exams.  ?  ? ?The following portions of the patient's history were reviewed and updated as appropriate: She  has a past medical history of Anemia, BRCA negative, Family history of breast cancer, GERD (gastroesophageal reflux disease), and Hiatal hernia.. ? ?Review of Systems: ?Review of Systems  ?Constitutional: Negative.   ?HENT: Negative.    ?Respiratory: Negative.    ?Cardiovascular: Negative.   ?Gastrointestinal:  Positive for heartburn.  ?     Takes daily medication which controls these symptoms.  ?Also reports hiatal hernia.   ?Genitourinary:  Positive for frequency.  ?     This is being managed by her PCP, with referral to urology. No new symptoms or concerns. Counseled to seek care sooner with any new concerns.   ?Musculoskeletal: Negative.   ?Skin: Negative.   ?Neurological:  Positive for tingling.  ?     Managed by PCP.   ?Endo/Heme/Allergies: Negative.   ?Psychiatric/Behavioral: Negative.     ?  ?Past Medical History:  ?     ?Patient Active Problem List  ?  Diagnosis Date Noted  ? History of COVID-19 03/05/2020  ? Sensation of  pressure in bladder area 03/02/2020  ? B12 deficiency 03/02/2020  ? Rash 03/01/2019  ? Dysuria 03/01/2019  ? Foot pain 08/30/2018  ? Hot flashes 08/30/2018  ? Menstrual irregularity 12/07/2017  ? Abnormal mammogram 08/24/2017  ? Healthcare maintenance 08/09/2016  ?    Mammogram 04/14/20 - [facility].  ?   ? Cyst of right breast 05/14/2016  ? Low back pain 02/06/2016  ? Abdominal pain 02/06/2016  ? Vitamin D deficiency 11/07/2015  ? GERD (gastroesophageal reflux disease) 11/07/2015  ? Encounter for screening colonoscopy 04/08/2014  ?  ?  ?Past Surgical History:  ?     ?Past Surgical History:  ?Procedure Laterality Date  ? BREAST CYST ASPIRATION   2010/ 2018  ?  Right breast cyst aspiration  ? CESAREAN SECTION   03/18/2001  ?  fetal indications  ? COLONOSCOPY N/A 06/01/2014  ?  Procedure: COLONOSCOPY;  Surgeon: Robert Bellow, MD;  Location: [facility] ENDOSCOPY;  Service: Endoscopy;  Laterality: N/A;  ? DILATION AND CURETTAGE OF UTERUS      ?  tab  ? UPPER GI ENDOSCOPY   2015  ?  Dr. Vira Agar  ?  ?  ?Gynecologic History:  ?Patient's last menstrual  period was 10/10/2016 (exact date). ?Last Pap: Results were: NILM no abnormalities  ?Last mammogram: 2022 Results were: BI-RAD I ?  ?Obstetric History: [id number] ?  ?Family History:  ?     ?Family History  ?Problem Relation Age of Onset  ? Lung cancer Father 38  ? Hypertension Mother    ? Diabetes Mother    ? Heart disease Mother    ? Breast cancer Daughter 67  ?      BCRA2 mutation   ? Diabetes Sister    ? Cervical cancer Sister    ? Prostate cancer Maternal Grandfather 60  ? Diabetes Sister    ? Cervical cancer Sister 54  ?  ?  ?Social History:  ?Social History  ?  ?     ?Socioeconomic History  ? Marital status: Single  ?    Spouse name: Not on file  ? Number of children: 2  ? Years of education: Not on file  ? Highest education level: Associate degree: academic program  ?Occupational History  ? Occupation: Patient Therapist, art Rep  ?Tobacco Use  ? Smoking status: Never Smoker   ? Smokeless tobacco: Never Used  ?Vaping Use  ? Vaping Use: Never used  ?Substance and Sexual Activity  ? Alcohol use: Yes  ?    Alcohol/week: 0.0 standard drinks  ?    Comment: occasionally  ? Drug use: No  ? Sexual activity: Yes  ?    Partners: Male  ?    Birth control/protection: Post-menopausal  ?Other Topics Concern  ? Not on file  ?Social History Narrative  ? Not on file  ?  ?Social Determinants of Health  ?  ?Financial Resource Strain: Not on file  ?Food Insecurity: Not on file  ?Transportation Needs: Not on file  ?Physical Activity: Not on file  ?Stress: Not on file  ?Social Connections: Not on file  ?Intimate Partner Violence: Not on file  ?  ?  ?Allergies:  ?No Known Allergies ?  ?Medications: ?       ?Prior to Admission medications   ?Medication Sig Start Date End Date Taking? Authorizing Provider  ?esomeprazole (NEXIUM) 40 MG packet MIX CONTENTS OF THE  CONTENTS OF 1 PACKET WITH  TABLESPOONSFUL WATER WAIT 2 TO 3 MINUTES THEN [place] BREAKFAST 08/19/19   Yes Einar Pheasant, MD  ?pramoxine-hydrocortisone (PROCTOCREAM-HC) 1-1 % rectal cream Place 1 application rectally 2 (two) times daily. 11/11/19   Yes Einar Pheasant, MD  ?triamcinolone cream (KENALOG) 0.1 % Apply 1 application topically 2 (two) times daily. 03/01/19   Yes Einar Pheasant, MD  ?  ?Objective:  ? ? General: NAD ?HEENT: normocephalic, anicteric ?Thyroid: no enlargement, no palpable nodules ?Pulmonary: No increased work of breathing, CTAB ?Cardiovascular: RRR, distal pulses 2+ ?Breast: Breast symmetrical, no tenderness, no palpable nodules or masses, no skin or nipple retraction present, no nipple discharge.  No axillary or supraclavicular lymphadenopathy. ?Abdomen: NABS, soft, non-tender, non-distended.  Umbilicus without lesions.  No hepatomegaly, splenomegaly or masses palpable. No evidence of hernia  ?Genitourinary: ?            External: Normal external female genitalia.  Normal urethral meatus, normal Bartholin's and Skene's  glands.   ?            Vagina: Normal vaginal mucosa, no evidence of prolapse.   ?            Cervix: Grossly normal in appearance, no bleeding ?  Uterus: Non-enlarged, mobile, normal contour.  No CMT ?            Adnexa: ovaries non-enlarged, no adnexal masses ?            Rectal: deferred ?            Lymphatic: no evidence of inguinal lymphadenopathy ?Extremities: no edema, erythema, or tenderness ?Neurologic: Grossly intact ?Psychiatric: mood appropriate, affect full ?  ?Female chaperone present for pelvic and breast  portions of the physical exam ?  ?  ?Assessment:  ? ?Assessment: 56 y.o. [id number] routine annual exam ?  ?Plan:  ?  ?Problem List Items Addressed This Visit   ?None ?   ?  ?   ?Visit Diagnoses   ?  Women's annual routine gynecological examination    -  Primary  ?  Relevant Orders  ?  Cytology - PAP  ?  Cervical cancer screening      ?  Relevant Orders  ?  Cytology - PAP  ?   ?  ?  ?1) Mammogram - recommend yearly screening mammogram.  Mammogram Is up to date with BIRADS 1 results 04/17/2021 ?  ?2) STI screening  was offered and declined ?  ?3) ASCCP guidelines and rational discussed.  Patient opts for every 5 years screening interval. Last pap was 04/21/2020 NILM results.  ?  ?4) Osteoporosis screening discussed and managed per PCP ?  ?Consider FDA-approved medical therapies in postmenopausal women and men aged 24 years and older, based on the following: ?a) A hip or vertebral (clinical or morphometric) fracture ?b) T-score ? -2.5 at the femoral neck or spine after appropriate evaluation to exclude secondary causes ?C) Low bone mass (T-score between -1.0 and -2.5 at the femoral neck or spine) and a 10-year probability of a hip fracture ? 3% or a 10-year probability of a major osteoporosis-related fracture ? 20% based on the [place]-adapted WHO algorithm ?  ?  ?5) Routine healthcare maintenance including cholesterol, diabetes screening discussed managed by PCP ?  ?6) Colonoscopy Has been done..   Screening recommended starting at age 72 for average risk individuals, age 8 for individuals deemed at increased risk (including African Americans) and recommended to continue until age 33.  For patient age

## 2021-05-04 NOTE — Patient Instructions (Addendum)
Return in 1 year for annual well-woman examination or earlier as needed.  ?

## 2021-06-04 ENCOUNTER — Ambulatory Visit: Payer: 59 | Admitting: Internal Medicine

## 2021-07-02 ENCOUNTER — Other Ambulatory Visit: Payer: Self-pay | Admitting: Internal Medicine

## 2022-05-30 ENCOUNTER — Other Ambulatory Visit: Payer: Self-pay | Admitting: Internal Medicine

## 2022-06-03 NOTE — Telephone Encounter (Signed)
LM for pt. Needs an appt prior to refilling.

## 2023-02-14 ENCOUNTER — Ambulatory Visit: Payer: Self-pay | Admitting: Internal Medicine

## 2023-02-14 NOTE — Telephone Encounter (Signed)
Pt aware of below. Gave verbal understanding.

## 2023-02-14 NOTE — Telephone Encounter (Signed)
If she is having increased congestion, cough and sob, recommend for her to be evaluated today.  Pending when symptoms started, may qualify for tamiflu. Also with sob, would prefer evaluation today.

## 2023-02-14 NOTE — Telephone Encounter (Signed)
Copied From CRM [id number] Reason for Triage: patient has had headache and congestion for a few days- son was diagnosed with flu several days ago and she is just checking on herself. Provider's office doesn't have anything open until Monday currently.    Chief Complaint: Flu like symptoms with exposure  Symptoms: Headache, cough, mild intermittent shortness of breath, sinus congestion and drainage  Frequency: Constant  Pertinent Negatives: Patient denies fever Disposition: [] ED /[] Urgent Care (no appt availability in office) / [] Appointment(In office/virtual)/ []  [facility]/ [x] Home Care/ [] Refused Recommended Disposition /[] [facility]/ []  Follow-up with PCP Additional Notes: Patient reports that her son was diagnosed with the flu recently and that he has been experiencing sinus congestion, cough, headache, and mild intermittent shortness of breath. She denies any fevers. Patient given home care advice and instructed to call back for new or worsening symptoms. Patient states she will go to urgent care if her symptoms worsen over the weekend.      Reason for Disposition  [1] Probable influenza (fever) with no complications AND [2] NOT HIGH RISK  Answer Assessment - Initial Assessment Questions 1. WORST SYMPTOM: "What is your worst symptom?" (e.g., cough, runny nose, muscle aches, headache, sore throat, fever)      Headache  2. ONSET: "When did your flu symptoms start?"      Headache today, other symptoms since Christmas  3. COUGH: "How bad is the cough?"       Moderate to severe 4. RESPIRATORY DISTRESS: "Describe your breathing."      "Feels heavy" 5. FEVER: "Do you have a fever?" If Yes, ask: "What is your temperature, how was it measured, and when did it start?"     No 6. EXPOSURE: "Were you exposed to someone with influenza?"       Yes, son diagnosed with flu  7. FLU VACCINE: "Did you get a flu shot this year?"     No 8. HIGH RISK DISEASE: "Do you have any  chronic medical problems?" (e.g., heart or lung disease, asthma, weak immune system, or other HIGH RISK conditions)     No 9. PREGNANCY: "Is there any chance you are pregnant?" "When was your last menstrual period?"     No 10. OTHER SYMPTOMS: "Do you have any other symptoms?"  (e.g., runny nose, muscle aches, headache, sore throat)       Runny nose, sinus congestion, cough, slight shortness of breath  Protocols used: Influenza (Flu) - [facility]

## 2023-03-06 ENCOUNTER — Telehealth: Payer: Self-pay | Admitting: Internal Medicine

## 2023-03-06 ENCOUNTER — Encounter: Payer: Self-pay | Admitting: Internal Medicine

## 2023-03-06 ENCOUNTER — Ambulatory Visit (INDEPENDENT_AMBULATORY_CARE_PROVIDER_SITE_OTHER): Payer: 59 | Admitting: Internal Medicine

## 2023-03-06 VITALS — BP 128/74 | HR 84 | Temp 98.3°F | Resp 16 | Ht 63.0 in | Wt 165.0 lb

## 2023-03-06 DIAGNOSIS — E559 Vitamin D deficiency, unspecified: Secondary | ICD-10-CM

## 2023-03-06 DIAGNOSIS — R300 Dysuria: Secondary | ICD-10-CM

## 2023-03-06 DIAGNOSIS — Z1231 Encounter for screening mammogram for malignant neoplasm of breast: Secondary | ICD-10-CM

## 2023-03-06 DIAGNOSIS — Z0000 Encounter for general adult medical examination without abnormal findings: Secondary | ICD-10-CM

## 2023-03-06 DIAGNOSIS — Z13220 Encounter for screening for lipoid disorders: Secondary | ICD-10-CM | POA: Diagnosis not present

## 2023-03-06 DIAGNOSIS — K219 Gastro-esophageal reflux disease without esophagitis: Secondary | ICD-10-CM

## 2023-03-06 DIAGNOSIS — R232 Flushing: Secondary | ICD-10-CM

## 2023-03-06 MED ORDER — PREDNISONE 10 MG PO TABS: ORAL_TABLET | ORAL | 0 refills | Status: DC

## 2023-03-06 NOTE — Progress Notes (Signed)
 Subjective:    Patient ID: Sabrina Schneider, female    DOB: [birth year], 58 y.o.   MRN: [id number]  Patient here for  Chief Complaint  Patient presents with   Annual Exam    HPI Here for a physical exam. Saw gyn 05/04/21 - last pap 04/21/2020 - NILM. On nexium for GERD. Has noticed increased acid reflux. Some dysphagia, regurgitation - despite taking PPI. Cough. No fever. No increased sob. Some hot flashes at times.    Past Medical History:  Diagnosis Date   Anemia    BRCA negative    Family history of breast cancer    daughter had breast cancer at age 71-BRCA positive. Patient is BRCA negative   GERD (gastroesophageal reflux disease)    Hiatal hernia    Past Surgical History:  Procedure Laterality Date   BREAST CYST ASPIRATION  2010/ 2018   Right breast cyst aspiration   CESAREAN SECTION  03/18/2001   fetal indications   COLONOSCOPY N/A 06/01/2014   Procedure: COLONOSCOPY;  Surgeon: Earline Mayotte, MD;  Location: [facility] ENDOSCOPY;  Service: Endoscopy;  Laterality: N/A;   DILATION AND CURETTAGE OF UTERUS     tab   UPPER GI ENDOSCOPY  2015   Dr. Mechele Collin   Family History  Problem Relation Age of Onset   Lung cancer Father 28   Hypertension Mother    Diabetes Mother    Heart disease Mother    Breast cancer Daughter 58       BCRA2 mutation    Diabetes Sister    Cervical cancer Sister    Prostate cancer Maternal Grandfather 39   Diabetes Sister    Cervical cancer Sister 57   Social History   Socioeconomic History   Marital status: Single    Spouse name: Not on file   Number of children: 2   Years of education: Not on file   Highest education level: Associate degree: academic program  Occupational History   Occupation: [name]  Tobacco Use   Smoking status: Never   Smokeless tobacco: Never  Vaping Use   Vaping status: Never Used  Substance and Sexual Activity   Alcohol use: Yes    Alcohol/week: 0.0 standard drinks of alcohol    Comment:  occasionally   Drug use: No   Sexual activity: Yes    Partners: Male    Birth control/protection: Post-menopausal  Other Topics Concern   Not on file  Social History Narrative   Not on file   Social Drivers of Health   Financial Resource Strain: Not on file  Food Insecurity: Not on file  Transportation Needs: Not on file  Physical Activity: Inactive (01/09/2017)   Exercise Vital Sign    Days of Exercise per Week: 0 days    Minutes of Exercise per Session: 0 min  Stress: No Stress Concern Present (01/09/2017)   [employer] of Occupational Health - Occupational Stress Questionnaire    Feeling of Stress : Only a little  Social Connections: Somewhat Isolated (01/09/2017)   Social Connection and Isolation Panel [NHANES]    Frequency of Communication with Friends and Family: Three times a week    Frequency of Social Gatherings with Friends and Family: Once a week    Attends Religious Services: More than 4 times per year    Active Member of [employer] or Organizations: No    Attends Banker Meetings: Never    Marital Status: Never married     Review  of Systems  Constitutional:  Negative for appetite change and fever.  HENT:  Negative for congestion, sinus pressure and sore throat.   Eyes:  Negative for pain and visual disturbance.  Respiratory:  Positive for cough. Negative for chest tightness and shortness of breath.   Cardiovascular:  Negative for chest pain and palpitations.  Gastrointestinal:  Negative for vomiting.       Acid reflux and dysphagia as outlined.   Genitourinary:  Negative for difficulty urinating and dysuria.  Musculoskeletal:  Negative for joint swelling and myalgias.  Skin:  Negative for color change and rash.  Neurological:  Negative for dizziness and headaches.  Hematological:  Negative for adenopathy. Does not bruise/bleed easily.  Psychiatric/Behavioral:  Negative for agitation and dysphoric mood.        Objective:     BP 128/74    Pulse 84   Temp 98.3 F (36.8 C)   Resp 16   Ht 5\' 3"  (1.6 m)   Wt 165 lb (74.8 kg)   LMP 10/10/2016 (Exact Date)   SpO2 98%   BMI 29.23 kg/m  Wt Readings from Last 3 Encounters:  03/06/23 165 lb (74.8 kg)  05/04/21 161 lb (73 kg)  03/05/21 161 lb 3.2 oz (73.1 kg)    Physical Exam Vitals reviewed.  Constitutional:      General: She is not in acute distress.    Appearance: Normal appearance. She is well-developed.  HENT:     Head: Normocephalic and atraumatic.     Right Ear: External ear normal.     Left Ear: External ear normal.     Mouth/Throat:     Pharynx: No oropharyngeal exudate or posterior oropharyngeal erythema.  Eyes:     General: No scleral icterus.       Right eye: No discharge.        Left eye: No discharge.     Conjunctiva/sclera: Conjunctivae normal.  Neck:     Thyroid: No thyromegaly.  Cardiovascular:     Rate and Rhythm: Normal rate and regular rhythm.  Pulmonary:     Effort: No tachypnea, accessory muscle usage or respiratory distress.     Breath sounds: Normal breath sounds. No decreased breath sounds or wheezing.  Chest:  Breasts:    Right: No inverted nipple, mass, nipple discharge or tenderness (no axillary adenopathy).     Left: No inverted nipple, mass, nipple discharge or tenderness (no axilarry adenopathy).  Abdominal:     General: Bowel sounds are normal.     Palpations: Abdomen is soft.     Tenderness: There is no abdominal tenderness.  Musculoskeletal:        General: No swelling or tenderness.     Cervical back: Neck supple.  Lymphadenopathy:     Cervical: No cervical adenopathy.  Skin:    Findings: No erythema or rash.  Neurological:     Mental Status: She is alert and oriented to person, place, and time.  Psychiatric:        Mood and Affect: Mood normal.        Behavior: Behavior normal.         Outpatient Encounter Medications as of 03/06/2023  Medication Sig   predniSONE (DELTASONE) 10 MG tablet Take 4 tablets x 1 day  and then decrease by 1/2 tablet per day until down to zero mg.   esomeprazole (NEXIUM) 40 MG packet MIX THE CONTENTS OF 1 PACKET  WITH TABLESPOONSFUL WATER WAIT 2 TO 3 MINUTES THEN DRINK DAILY  BEFORE  BREAKFAST   pramoxine-hydrocortisone (PROCTOCREAM-HC) 1-1 % rectal cream Place 1 application rectally 2 (two) times daily.   [DISCONTINUED] SODIUM FLUORIDE 5000 SENSITIVE 1.1-5 % GEL Take by mouth.   No facility-administered encounter medications on file as of 03/06/2023.     Lab Results  Component Value Date   WBC 4.8 03/06/2023   HGB 12.3 03/06/2023   HCT 36.4 03/06/2023   PLT 267 03/06/2023   GLUCOSE 88 03/06/2023   CHOL 180 03/06/2023   TRIG 147 03/06/2023   HDL 61 03/06/2023   LDLCALC 94 03/06/2023   ALT 14 03/06/2023   AST 15 03/06/2023   NA 143 03/06/2023   K 4.2 03/06/2023   CL 107 (H) 03/06/2023   CREATININE 0.78 03/06/2023   BUN 9 03/06/2023   CO2 26 03/06/2023   TSH 1.480 03/06/2023   HGBA1C 5.4 08/21/2017    MM 3D SCREEN BREAST BILATERAL Result Date: 04/18/2021 CLINICAL DATA:  Screening. EXAM: DIGITAL SCREENING BILATERAL MAMMOGRAM WITH TOMOSYNTHESIS AND CAD TECHNIQUE: Bilateral screening digital craniocaudal and mediolateral oblique mammograms were obtained. Bilateral screening digital breast tomosynthesis was performed. The images were evaluated with computer-aided detection. COMPARISON:  Previous exam(s). ACR Breast Density Category c: The breast tissue is heterogeneously dense, which may obscure small masses. FINDINGS: There are no findings suspicious for malignancy. IMPRESSION: No mammographic evidence of malignancy. A result letter of this screening mammogram will be mailed directly to the patient. RECOMMENDATION: Screening mammogram in one year. (Code:SM-B-01Y) BI-RADS CATEGORY  1: Negative. Electronically Signed   By: Meda Klinefelter M.D.   On: 04/18/2021 09:40       Assessment & Plan:  Vitamin D deficiency Assessment & Plan: Continue vitamin D supplements.  Follow vitamin D level.   Orders: -     CBC with Differential/Platelet -     Comprehensive metabolic panel -     TSH -     VITAMIN D 25 Hydroxy (Vit-D Deficiency, Fractures)  Healthcare maintenance Assessment & Plan: Physical today 03/06/23.  Mammogram 04/17/21 - Birads I.  Schedule f/u mammogram.  Order placed.  Colonoscopy 2016 (Dr Lemar Livings).  Recommended f/u in 10 years.     Screening cholesterol level -     Lipid panel  Dysuria Assessment & Plan: Noticed some discomfort with urination. Check urinalysis and culture to confirm no infection.   Orders: -     Urinalysis, Routine w reflex microscopic -     Urine Culture  Encounter for screening mammogram for malignant neoplasm of breast -     3D Screening Mammogram, Left and Right; Future  Hot flashes Assessment & Plan: Have discussed treatment options. Notify if desires to start.    Gastroesophageal reflux disease, unspecified whether esophagitis present Assessment & Plan: Increased acid reflux as outlined. Discussed PPI. Take regularly. Also describes dysphagia. Discussed need for GI evaluation and further w/up. Agreeable for referral.    Other orders -     predniSONE; Take 4 tablets x 1 day and then decrease by 1/2 tablet per day until down to zero mg.  Dispense: 18 tablet; Refill: 0     [place] Fenton, MD

## 2023-03-06 NOTE — Patient Instructions (Signed)
 Please call to schedule your mammogram. You are overdue.  [facility] [phone]

## 2023-03-06 NOTE — Assessment & Plan Note (Signed)
 Physical today 03/06/23.  Mammogram 04/17/21 - Birads I.  Schedule f/u mammogram.  Order placed.  Colonoscopy 2016 (Dr Lemar Livings).  Recommended f/u in 10 years.

## 2023-03-06 NOTE — Telephone Encounter (Signed)
 My chart message sent to Sabrina Schneider to let her know that prednisone rx has been sent in.

## 2023-03-07 LAB — URINALYSIS, ROUTINE W REFLEX MICROSCOPIC
Bilirubin Urine: NEGATIVE
Hgb urine dipstick: NEGATIVE
Ketones, ur: NEGATIVE
Nitrite: NEGATIVE
Specific Gravity, Urine: 1.025 (ref 1.000–1.030)
Total Protein, Urine: NEGATIVE
Urine Glucose: NEGATIVE
Urobilinogen, UA: 1 (ref 0.0–1.0)
pH: 7 (ref 5.0–8.0)

## 2023-03-07 LAB — LIPID PANEL
Chol/HDL Ratio: 3 {ratio} (ref 0.0–4.4)
Cholesterol, Total: 180 mg/dL (ref 100–199)
HDL: 61 mg/dL (ref >39)
LDL Chol Calc (NIH): 94 mg/dL (ref 0–99)
Triglycerides: 147 mg/dL (ref 0–149)
VLDL Cholesterol Cal: 25 mg/dL (ref 5–40)

## 2023-03-07 LAB — COMPREHENSIVE METABOLIC PANEL
ALT: 14 [IU]/L (ref 0–32)
AST: 15 [IU]/L (ref 0–40)
Albumin: 3.9 g/dL (ref 3.8–4.9)
Alkaline Phosphatase: 109 [IU]/L (ref 44–121)
BUN/Creatinine Ratio: 12 (ref 9–23)
BUN: 9 mg/dL (ref 6–24)
Bilirubin Total: 0.3 mg/dL (ref 0.0–1.2)
CO2: 26 mmol/L (ref 20–29)
Calcium: 9.1 mg/dL (ref 8.7–10.2)
Chloride: 107 mmol/L — ABNORMAL HIGH (ref 96–106)
Creatinine, Ser: 0.78 mg/dL (ref 0.57–1.00)
Globulin, Total: 2.7 g/dL (ref 1.5–4.5)
Glucose: 88 mg/dL (ref 70–99)
Potassium: 4.2 mmol/L (ref 3.5–5.2)
Sodium: 143 mmol/L (ref 134–144)
Total Protein: 6.6 g/dL (ref 6.0–8.5)
eGFR: 87 mL/min/{1.73_m2} (ref >59)

## 2023-03-07 LAB — CBC WITH DIFFERENTIAL/PLATELET
Basophils Absolute: 0 10*3/uL (ref 0.0–0.2)
Basos: 1 %
EOS (ABSOLUTE): 0.3 10*3/uL (ref 0.0–0.4)
Eos: 6 %
Hematocrit: 36.4 % (ref 34.0–46.6)
Hemoglobin: 12.3 g/dL (ref 11.1–15.9)
Immature Grans (Abs): 0 10*3/uL (ref 0.0–0.1)
Immature Granulocytes: 0 %
Lymphocytes Absolute: 1.7 10*3/uL (ref 0.7–3.1)
Lymphs: 35 %
MCH: 31.1 pg (ref 26.6–33.0)
MCHC: 33.8 g/dL (ref 31.5–35.7)
MCV: 92 fL (ref 79–97)
Monocytes Absolute: 0.4 10*3/uL (ref 0.1–0.9)
Monocytes: 8 %
Neutrophils Absolute: 2.4 10*3/uL (ref 1.4–7.0)
Neutrophils: 50 %
Platelets: 267 10*3/uL (ref 150–450)
RBC: 3.95 x10E6/uL (ref 3.77–5.28)
RDW: 11.8 % (ref 11.7–15.4)
WBC: 4.8 10*3/uL (ref 3.4–10.8)

## 2023-03-07 LAB — URINE CULTURE
MICRO NUMBER:: 16108679
Result:: NO GROWTH
SPECIMEN QUALITY:: ADEQUATE

## 2023-03-07 LAB — TSH: TSH: 1.48 u[IU]/mL (ref 0.450–4.500)

## 2023-03-07 LAB — VITAMIN D 25 HYDROXY (VIT D DEFICIENCY, FRACTURES): Vit D, 25-Hydroxy: 13.7 ng/mL — ABNORMAL LOW (ref 30.0–100.0)

## 2023-03-09 ENCOUNTER — Encounter: Payer: Self-pay | Admitting: Internal Medicine

## 2023-03-09 NOTE — Assessment & Plan Note (Signed)
Continue vitamin D supplements.  Follow vitamin D level.   

## 2023-03-09 NOTE — Assessment & Plan Note (Signed)
 Increased acid reflux as outlined. Discussed PPI. Take regularly. Also describes dysphagia. Discussed need for GI evaluation and further w/up. Agreeable for referral.

## 2023-03-09 NOTE — Assessment & Plan Note (Signed)
 Have discussed treatment options. Notify if desires to start.

## 2023-03-09 NOTE — Assessment & Plan Note (Signed)
 Noticed some discomfort with urination. Check urinalysis and culture to confirm no infection.

## 2023-03-10 NOTE — Telephone Encounter (Signed)
 LM for patient

## 2023-03-10 NOTE — Telephone Encounter (Signed)
 Since this message was from 03/06/22 = please call and see what symptoms she is currently having.  Has anything changed, new symptoms?

## 2023-03-13 ENCOUNTER — Telehealth: Payer: Self-pay

## 2023-03-13 DIAGNOSIS — R319 Hematuria, unspecified: Secondary | ICD-10-CM

## 2023-03-13 NOTE — Telephone Encounter (Signed)
 Copied from CRM [id number]. Topic: Clinical - Lab/Test Results >> Mar 13, 2023  4:47 PM Corin V wrote: Reason for CRM: Patient returned call for lab work. She asked that the Vitamin D be called into [facility] [address], [place] - [address]. She said she has not had and vaginal bleeding or visible blood in her urine. She scheduled her repeat UA for her first available day, 03/21/23 if the repeat UA lab can be ordered.

## 2023-03-13 NOTE — Telephone Encounter (Signed)
 Copied from CRM [id number]. Topic: Clinical - Medical Advice >> Mar 13, 2023 12:51 PM Sabrina Schneider wrote: Reason for CRM: Patient returning phone call from [place] - states nothing has changed, no symptoms.

## 2023-03-13 NOTE — Telephone Encounter (Signed)
 Please clarify if feeling better now. Any symptoms now?

## 2023-03-13 NOTE — Telephone Encounter (Signed)
 Per patient, no change and no symptoms.

## 2023-03-13 NOTE — Telephone Encounter (Signed)
 LM for patient

## 2023-03-14 NOTE — Addendum Note (Signed)
 Addended by: Rita Ohara D on: 03/14/2023 01:25 PM   Modules accepted: Orders

## 2023-03-14 NOTE — Telephone Encounter (Signed)
 Urine ordered

## 2023-03-14 NOTE — Telephone Encounter (Signed)
 See other note

## 2023-03-17 ENCOUNTER — Telehealth: Payer: Self-pay | Admitting: Internal Medicine

## 2023-03-17 NOTE — Telephone Encounter (Signed)
 Patient need lab orders.

## 2023-03-17 NOTE — Addendum Note (Signed)
 Addended by: Rita Ohara D on: 03/17/2023 02:48 PM   Modules accepted: Orders

## 2023-03-17 NOTE — Telephone Encounter (Signed)
 Urine order fixed.

## 2023-03-19 MED ORDER — VITAMIN D (ERGOCALCIFEROL) 1.25 MG (50000 UNIT) PO CAPS: 50000.0000 [IU] | ORAL_CAPSULE | ORAL | 0 refills | Status: DC

## 2023-03-19 NOTE — Addendum Note (Signed)
 Addended by: Charm Barges on: 03/19/2023 01:11 PM   Modules accepted: Orders

## 2023-03-19 NOTE — Telephone Encounter (Signed)
 Spoke to Ms Pomerleau. Cough is better. No upper symptoms now. Also, discussed vitamin D. Sent in rx for ergocalciferol 50,000 q week x 12 weeks. Will start vitamin D3 1000 units per day after complete the 12 weeks.

## 2023-03-21 ENCOUNTER — Other Ambulatory Visit (INDEPENDENT_AMBULATORY_CARE_PROVIDER_SITE_OTHER): Payer: 59

## 2023-03-21 DIAGNOSIS — R319 Hematuria, unspecified: Secondary | ICD-10-CM

## 2023-03-21 LAB — URINALYSIS, ROUTINE W REFLEX MICROSCOPIC
Bilirubin Urine: NEGATIVE
Hgb urine dipstick: NEGATIVE
Leukocytes,Ua: NEGATIVE
Nitrite: NEGATIVE
Specific Gravity, Urine: 1.025 (ref 1.000–1.030)
Total Protein, Urine: NEGATIVE
Urine Glucose: NEGATIVE
Urobilinogen, UA: 1 (ref 0.0–1.0)
pH: 6 (ref 5.0–8.0)

## 2023-03-27 DIAGNOSIS — R319 Hematuria, unspecified: Secondary | ICD-10-CM

## 2023-03-27 NOTE — Telephone Encounter (Signed)
 Order placed for CT renal study. Pt notified via my chart.

## 2023-03-28 ENCOUNTER — Telehealth: Payer: Self-pay | Admitting: Internal Medicine

## 2023-03-28 NOTE — Telephone Encounter (Signed)
 I will be happy to check her to see if we can figure out why she is having the pain.

## 2023-03-28 NOTE — Telephone Encounter (Signed)
 Lft pt vm to call ofc to sch CT. thanks

## 2023-04-10 ENCOUNTER — Ambulatory Visit

## 2023-04-11 ENCOUNTER — Ambulatory Visit
Admission: RE | Admit: 2023-04-11 | Discharge: 2023-04-11 | Disposition: A | Discharge status: Home / Self Care | Source: Ambulatory Visit | Attending: Internal Medicine | Admitting: Internal Medicine

## 2023-04-11 DIAGNOSIS — R319 Hematuria, unspecified: Secondary | ICD-10-CM | POA: Diagnosis present

## 2023-06-03 ENCOUNTER — Ambulatory Visit (INDEPENDENT_AMBULATORY_CARE_PROVIDER_SITE_OTHER): Payer: 59 | Admitting: Internal Medicine

## 2023-06-03 ENCOUNTER — Ambulatory Visit (INDEPENDENT_AMBULATORY_CARE_PROVIDER_SITE_OTHER)

## 2023-06-03 ENCOUNTER — Encounter: Payer: Self-pay | Admitting: Internal Medicine

## 2023-06-03 VITALS — BP 122/68 | HR 89 | Temp 98.0°F | Resp 16 | Ht 63.0 in | Wt 169.0 lb

## 2023-06-03 DIAGNOSIS — Z1211 Encounter for screening for malignant neoplasm of colon: Secondary | ICD-10-CM | POA: Diagnosis not present

## 2023-06-03 DIAGNOSIS — R319 Hematuria, unspecified: Secondary | ICD-10-CM

## 2023-06-03 DIAGNOSIS — K219 Gastro-esophageal reflux disease without esophagitis: Secondary | ICD-10-CM | POA: Diagnosis not present

## 2023-06-03 DIAGNOSIS — R053 Chronic cough: Secondary | ICD-10-CM | POA: Diagnosis not present

## 2023-06-03 DIAGNOSIS — E559 Vitamin D deficiency, unspecified: Secondary | ICD-10-CM

## 2023-06-03 LAB — URINALYSIS, ROUTINE W REFLEX MICROSCOPIC
Bilirubin Urine: NEGATIVE
Ketones, ur: NEGATIVE
Nitrite: NEGATIVE
Specific Gravity, Urine: 1.02 (ref 1.000–1.030)
Total Protein, Urine: NEGATIVE
Urine Glucose: NEGATIVE
Urobilinogen, UA: 0.2 (ref 0.0–1.0)
pH: 6.5 (ref 5.0–8.0)

## 2023-06-03 NOTE — Assessment & Plan Note (Signed)
 Steroid nasal spray as directed. Wanted to hold on prednisone . Treat acid relfux. Follow. Check cxr. Call with update.

## 2023-06-03 NOTE — Progress Notes (Signed)
 Subjective:    Patient ID: Sabrina Schneider, female    DOB: August 24, 1965, 58 y.o.   MRN: [id number]  Patient here for  Chief Complaint  Patient presents with   Medical Management of Chronic Issues    HPI Here for a scheduled follow up - follow up regarding acid reflux , dysphagia and previous hematuria. Recent renal stone study - negative. Had recommended referral to urology. Wanted to hold on referral. Has not noticed any blood in her urine. Agreeable to recheck urine. On nexium . Takes daily Monday through Friday. Out of routine on weekend and sometimes will miss doses on the weekend. Persistent acid reflux. Has to take occasional TuMS or pepcid to control symptoms. Also reports some dysphagia. No chest pain or sob. Persistent cough. Coughing fits at times. Some increased drainage. No abdominal pain or bowel change reported.    Past Medical History:  Diagnosis Date   Anemia    BRCA negative    Family history of breast cancer    daughter had breast cancer at age 65-BRCA positive. Patient is BRCA negative   GERD (gastroesophageal reflux disease)    Hiatal hernia    Past Surgical History:  Procedure Laterality Date   BREAST CYST ASPIRATION  2010/ 2018   Right breast cyst aspiration   CESAREAN SECTION  03/18/2001   fetal indications   COLONOSCOPY N/A 06/01/2014   Procedure: COLONOSCOPY;  Surgeon: Marshall Skeeter, MD;  Location: [facility] ENDOSCOPY;  Service: Endoscopy;  Laterality: N/A;   DILATION AND CURETTAGE OF UTERUS     tab   UPPER GI ENDOSCOPY  2015   Dr. Felicita Horns   Family History  Problem Relation Age of Onset   Lung cancer Father 108   Hypertension Mother    Diabetes Mother    Heart disease Mother    Breast cancer Daughter 35       BCRA2 mutation    Diabetes Sister    Cervical cancer Sister    Prostate cancer Maternal Grandfather 94   Diabetes Sister    Cervical cancer Sister 38   Social History   Socioeconomic History   Marital status: Single    Spouse name: Not on  file   Number of children: 2   Years of education: Not on file   Highest education level: Associate degree: academic program  Occupational History   Occupation: [name]  Tobacco Use   Smoking status: Never   Smokeless tobacco: Never  Vaping Use   Vaping status: Never Used  Substance and Sexual Activity   Alcohol use: Yes    Alcohol/week: 0.0 standard drinks of alcohol    Comment: occasionally   Drug use: No   Sexual activity: Yes    Partners: Male    Birth control/protection: Post-menopausal  Other Topics Concern   Not on file  Social History Narrative   Not on file   Social Drivers of Health   Financial Resource Strain: Not on file  Food Insecurity: Not on file  Transportation Needs: Not on file  Physical Activity: Inactive (01/09/2017)   Exercise Vital Sign    Days of Exercise per Week: 0 days    Minutes of Exercise per Session: 0 min  Stress: No Stress Concern Present (01/09/2017)   [employer] of Occupational Health - Occupational Stress Questionnaire    Feeling of Stress : Only a little  Social Connections: Somewhat Isolated (01/09/2017)   Social Connection and Isolation Panel [NHANES]    Frequency of Communication  with Friends and Family: Three times a week    Frequency of Social Gatherings with Friends and Family: Once a week    Attends Religious Services: More than 4 times per year    Active Member of [employer] or Organizations: No    Attends Banker Meetings: Never    Marital Status: Never married     Review of Systems  Constitutional:  Negative for appetite change and unexpected weight change.  HENT:  Positive for postnasal drip. Negative for sinus pressure.   Respiratory:  Positive for cough. Negative for chest tightness and shortness of breath.   Cardiovascular:  Negative for chest pain, palpitations and leg swelling.  Gastrointestinal:  Negative for abdominal pain, diarrhea, nausea and vomiting.       Acid reflux  and dysphagia as outlined.   Genitourinary:  Negative for difficulty urinating and dysuria.  Musculoskeletal:  Negative for joint swelling and myalgias.  Skin:  Negative for color change and rash.  Neurological:  Negative for dizziness and headaches.  Psychiatric/Behavioral:  Negative for agitation and dysphoric mood.        Objective:     BP 122/68   Pulse 89   Temp 98 F (36.7 C)   Resp 16   Ht 5\' 3"  (1.6 m)   Wt 169 lb (76.7 kg)   LMP 10/10/2016 (Exact Date)   SpO2 98%   BMI 29.94 kg/m  Wt Readings from Last 3 Encounters:  06/03/23 169 lb (76.7 kg)  03/06/23 165 lb (74.8 kg)  05/04/21 161 lb (73 kg)    Physical Exam Vitals reviewed.  Constitutional:      General: She is not in acute distress.    Appearance: Normal appearance.  HENT:     Head: Normocephalic and atraumatic.     Right Ear: External ear normal.     Left Ear: External ear normal.     Mouth/Throat:     Pharynx: No oropharyngeal exudate or posterior oropharyngeal erythema.  Eyes:     General: No scleral icterus.       Right eye: No discharge.        Left eye: No discharge.     Conjunctiva/sclera: Conjunctivae normal.  Neck:     Thyroid : No thyromegaly.  Cardiovascular:     Rate and Rhythm: Normal rate and regular rhythm.  Pulmonary:     Effort: No respiratory distress.     Breath sounds: Normal breath sounds. No wheezing.  Abdominal:     General: Bowel sounds are normal.     Palpations: Abdomen is soft.     Tenderness: There is no abdominal tenderness.  Musculoskeletal:        General: No swelling or tenderness.     Cervical back: Neck supple. No tenderness.  Lymphadenopathy:     Cervical: No cervical adenopathy.  Skin:    Findings: No erythema or rash.  Neurological:     Mental Status: She is alert.  Psychiatric:        Mood and Affect: Mood normal.        Behavior: Behavior normal.         Outpatient Encounter Medications as of 06/03/2023  Medication Sig   esomeprazole  (NEXIUM )  40 MG packet MIX THE CONTENTS OF 1 PACKET  WITH TABLESPOONSFUL WATER WAIT 2 TO 3 MINUTES THEN DRINK DAILY  BEFORE BREAKFAST   pramoxine-hydrocortisone  (PROCTOCREAM-HC) 1-1 % rectal cream Place 1 application rectally 2 (two) times daily.   [DISCONTINUED] predniSONE  (DELTASONE ) 10 MG tablet  Take 4 tablets x 1 day and then decrease by 1/2 tablet per day until down to zero mg.   [DISCONTINUED] Vitamin D , Ergocalciferol , (DRISDOL ) 1.25 MG (50000 UNIT) CAPS capsule Take 1 capsule (50,000 Units total) by mouth every 7 (seven) days.   No facility-administered encounter medications on file as of 06/03/2023.     Lab Results  Component Value Date   WBC 4.8 03/06/2023   HGB 12.3 03/06/2023   HCT 36.4 03/06/2023   PLT 267 03/06/2023   GLUCOSE 88 03/06/2023   CHOL 180 03/06/2023   TRIG 147 03/06/2023   HDL 61 03/06/2023   LDLCALC 94 03/06/2023   ALT 14 03/06/2023   AST 15 03/06/2023   NA 143 03/06/2023   K 4.2 03/06/2023   CL 107 (H) 03/06/2023   CREATININE 0.78 03/06/2023   BUN 9 03/06/2023   CO2 26 03/06/2023   TSH 1.480 03/06/2023   HGBA1C 5.4 08/21/2017    CT RENAL STONE STUDY Result Date: 04/15/2023 CLINICAL DATA:  Hematuria EXAM: CT ABDOMEN AND PELVIS WITHOUT CONTRAST TECHNIQUE: Multidetector CT imaging of the abdomen and pelvis was performed following the standard protocol without IV contrast. RADIATION DOSE REDUCTION: This exam was performed according to the departmental dose-optimization program which includes automated exposure control, adjustment of the mA and/or kV according to patient size and/or use of iterative reconstruction technique. COMPARISON:  CT abdomen and pelvis February 20, 1998 18 FINDINGS: Lower chest: No infiltrates or consolidations, no pleural effusions small hiatal hernia Hepatobiliary: Liver normal size no masses no biliary dilatation. Gallbladder unremarkable. No gallstones. Pancreas: Pancreas normal size. No masses calcifications or inflammatory changes. Spleen:  Spleen normal size.  No masses. Adrenals/Urinary Tract: Adrenal glands are normal size. Follow-up recommended. Kidneys are normal. No masses calcifications or hydronephrosis Stomach/Bowel: No small or large bowel obstruction or inflammatory changes. Moderate amount of residual fecal material throughout the colon without obstruction or constipation. Vascular/Lymphatic: No significant vascular findings are present. No enlarged abdominal or pelvic lymph nodes. Reproductive: Uterus and bilateral adnexa are unremarkable. Other: Anterior abdominal wall unremarkable without evidence of umbilical or inguinal hernias Musculoskeletal: Visualized portion of the thoracolumbar spine and pelvic structures grossly unremarkable without evidence of fracture bony abnormalities or soft tissue masses. IMPRESSION: *No acute findings in the abdomen or pelvis. No evidence of kidney stones or hydronephrosis.  No bladder stones. *Moderate amount of residual fecal material throughout the colon without obstruction or constipation. *Small hiatal hernia. Electronically Signed   By: Fredrich Jefferson M.D.   On: 04/15/2023 10:27       Assessment & Plan:  Hematuria, unspecified type Assessment & Plan: Recheck urine today. Previuos red blood cells. CT unrevealing. Wanted to hold on urology referral. Recheck urine today   Orders: -     Urinalysis, Routine w reflex microscopic  Persistent cough Assessment & Plan: Steroid nasal spray as directed. Wanted to hold on prednisone . Treat acid relfux. Follow. Check cxr. Call with update.   Orders: -     DG Chest 2 View; Future  Gastroesophageal reflux disease, unspecified whether esophagitis present Assessment & Plan: Persistent acid reflux and dysphagia. Taking nexium . Take nexium  daily - 30 minutes before breakfast (or lunch on weekend) and pepcid 30 minutes before evening meal. Given persistent symptoms despite PPI and given dysphagia, will refer to GI for further evaluation    Orders: -     Ambulatory referral to Gastroenterology  Encounter for screening colonoscopy Assessment & Plan: S/p colonoscopy 05/2014.  Recommended f/u colonoscopy in 10  years.    Vitamin D  deficiency Assessment & Plan: Did not complete rx for vitamin D . Send in new rx.  Follow.       Dellar Fenton, MD

## 2023-06-03 NOTE — Assessment & Plan Note (Signed)
 Recheck urine today. Previuos red blood cells. CT unrevealing. Wanted to hold on urology referral. Recheck urine today

## 2023-06-03 NOTE — Patient Instructions (Signed)
 Nasacort  nasal spray - 2 sprays each nostril one time per day.   Continue nexium  - take 30 minutes before breakfast  Take pepcid 30 minutes before evening meal.

## 2023-06-03 NOTE — Assessment & Plan Note (Signed)
S/p colonoscopy 05/2014.  Recommended f/u colonoscopy in 10 years.

## 2023-06-03 NOTE — Assessment & Plan Note (Signed)
 Did not complete rx for vitamin D . Send in new rx.  Follow.

## 2023-06-03 NOTE — Assessment & Plan Note (Signed)
 Persistent acid reflux and dysphagia. Taking nexium . Take nexium  daily - 30 minutes before breakfast (or lunch on weekend) and pepcid 30 minutes before evening meal. Given persistent symptoms despite PPI and given dysphagia, will refer to GI for further evaluation

## 2023-06-04 ENCOUNTER — Ambulatory Visit: Payer: Self-pay | Admitting: Internal Medicine

## 2023-06-13 ENCOUNTER — Ambulatory Visit
Admission: RE | Admit: 2023-06-13 | Discharge: 2023-06-13 | Disposition: A | Discharge status: Home / Self Care | Source: Ambulatory Visit | Attending: Internal Medicine | Admitting: Internal Medicine

## 2023-06-13 DIAGNOSIS — Z1231 Encounter for screening mammogram for malignant neoplasm of breast: Secondary | ICD-10-CM | POA: Diagnosis present

## 2023-07-07 ENCOUNTER — Other Ambulatory Visit: Payer: Self-pay | Admitting: Internal Medicine

## 2023-07-07 ENCOUNTER — Encounter: Payer: Self-pay | Admitting: Internal Medicine

## 2023-07-08 MED ORDER — ESOMEPRAZOLE MAGNESIUM 40 MG PO PACK: PACK | ORAL | 3 refills | Status: AC

## 2023-07-22 ENCOUNTER — Telehealth: Payer: Self-pay | Admitting: Internal Medicine

## 2023-07-22 DIAGNOSIS — E538 Deficiency of other specified B group vitamins: Secondary | ICD-10-CM

## 2023-07-22 DIAGNOSIS — Z13220 Encounter for screening for lipoid disorders: Secondary | ICD-10-CM

## 2023-07-22 DIAGNOSIS — K219 Gastro-esophageal reflux disease without esophagitis: Secondary | ICD-10-CM

## 2023-07-22 DIAGNOSIS — E559 Vitamin D deficiency, unspecified: Secondary | ICD-10-CM

## 2023-07-22 NOTE — Telephone Encounter (Signed)
 Lab order needed

## 2023-07-23 ENCOUNTER — Encounter: Payer: Self-pay | Admitting: Internal Medicine

## 2023-07-23 NOTE — Telephone Encounter (Signed)
Order placed for future labs

## 2023-07-23 NOTE — Telephone Encounter (Signed)
 Yes. I can see your son. If he is ok with this, please give us  his contact information and I will forward this information to my scheduler.   Dr Glendia

## 2023-08-05 ENCOUNTER — Other Ambulatory Visit (INDEPENDENT_AMBULATORY_CARE_PROVIDER_SITE_OTHER)

## 2023-08-05 ENCOUNTER — Ambulatory Visit: Payer: Self-pay | Admitting: Internal Medicine

## 2023-08-05 DIAGNOSIS — E538 Deficiency of other specified B group vitamins: Secondary | ICD-10-CM

## 2023-08-05 DIAGNOSIS — Z13220 Encounter for screening for lipoid disorders: Secondary | ICD-10-CM | POA: Diagnosis not present

## 2023-08-05 DIAGNOSIS — K219 Gastro-esophageal reflux disease without esophagitis: Secondary | ICD-10-CM

## 2023-08-05 DIAGNOSIS — E559 Vitamin D deficiency, unspecified: Secondary | ICD-10-CM

## 2023-08-05 LAB — COMPREHENSIVE METABOLIC PANEL WITH GFR
ALT: 12 U/L (ref 0–35)
AST: 15 U/L (ref 0–37)
Albumin: 4 g/dL (ref 3.5–5.2)
Alkaline Phosphatase: 80 U/L (ref 39–117)
BUN: 15 mg/dL (ref 6–23)
CO2: 28 meq/L (ref 19–32)
Calcium: 8.7 mg/dL (ref 8.4–10.5)
Chloride: 105 meq/L (ref 96–112)
Creatinine, Ser: 0.91 mg/dL (ref 0.40–1.20)
GFR: 68.66 mL/min (ref >60.00)
Glucose, Bld: 93 mg/dL (ref 70–99)
Potassium: 4 meq/L (ref 3.5–5.1)
Sodium: 139 meq/L (ref 135–145)
Total Bilirubin: 0.6 mg/dL (ref 0.2–1.2)
Total Protein: 6.9 g/dL (ref 6.0–8.3)

## 2023-08-05 LAB — VITAMIN D 25 HYDROXY (VIT D DEFICIENCY, FRACTURES): VITD: 18.95 ng/mL — ABNORMAL LOW (ref 30.00–100.00)

## 2023-08-05 LAB — LIPID PANEL
Cholesterol: 175 mg/dL (ref 0–200)
HDL: 58.1 mg/dL (ref >39.00)
LDL Cholesterol: 98 mg/dL (ref 0–99)
NonHDL: 117.33
Total CHOL/HDL Ratio: 3
Triglycerides: 99 mg/dL (ref 0.0–149.0)
VLDL: 19.8 mg/dL (ref 0.0–40.0)

## 2023-08-05 LAB — VITAMIN B12: Vitamin B-12: 190 pg/mL — ABNORMAL LOW (ref 211–911)

## 2023-08-07 ENCOUNTER — Ambulatory Visit: Admitting: Internal Medicine

## 2023-08-07 VITALS — BP 120/74 | HR 84 | Resp 16 | Ht 63.0 in | Wt 171.0 lb

## 2023-08-07 DIAGNOSIS — K625 Hemorrhage of anus and rectum: Secondary | ICD-10-CM

## 2023-08-07 DIAGNOSIS — E559 Vitamin D deficiency, unspecified: Secondary | ICD-10-CM

## 2023-08-07 DIAGNOSIS — R232 Flushing: Secondary | ICD-10-CM

## 2023-08-07 DIAGNOSIS — K219 Gastro-esophageal reflux disease without esophagitis: Secondary | ICD-10-CM | POA: Diagnosis not present

## 2023-08-07 DIAGNOSIS — N952 Postmenopausal atrophic vaginitis: Secondary | ICD-10-CM

## 2023-08-07 DIAGNOSIS — E538 Deficiency of other specified B group vitamins: Secondary | ICD-10-CM

## 2023-08-07 MED ORDER — CYANOCOBALAMIN 1000 MCG/ML IJ SOLN
1000.0000 ug | Freq: Once | INTRAMUSCULAR | Status: AC
Start: 2023-08-07 — End: 2023-08-07
  Administered 2023-08-07: 1000 ug via INTRAMUSCULAR

## 2023-08-07 NOTE — Assessment & Plan Note (Addendum)
 Low vitami d level. Vitamin D  3 supplement. Follow.

## 2023-08-07 NOTE — Progress Notes (Signed)
 Subjective:    Patient ID: Sabrina Schneider, female    DOB: 10-Apr-1965, 58 y.o.   MRN: [id number]  Patient here for  Chief Complaint  Patient presents with   Medical Management of Chronic Issues    HPI Here for a scheduled follow up - follow up regarding acid reflux , dysphagia and previous hematuria. Recent renal stone study - negative. Had recommended referral to urology. Wanted to hold on referral. On nexium  for acid reflux. Persistent acid reflux and dysphagia last visit. Pepcid added to take before evening meal. Also referred to GI. Saw GI 07/30/23 - recommended colonoscopy and EGD. Reports had a bowel movement last week - noticed BRB on tissue. Some straining. Denies increased problems with constipation. Another bowel movement - noticed blood in stool and on tissue. Subsequent bowel movements - no blood. Reports increased hot flashes.  Also concerned regarding vaginal atrophy. Discussed treatment options. Desires not to use estrogen.    Past Medical History:  Diagnosis Date   Anemia    BRCA negative    Family history of breast cancer    daughter had breast cancer at age 102-BRCA positive. Patient is BRCA negative   GERD (gastroesophageal reflux disease)    Hiatal hernia    Past Surgical History:  Procedure Laterality Date   BREAST CYST ASPIRATION  2010/ 2018   Right breast cyst aspiration   CESAREAN SECTION  03/18/2001   fetal indications   COLONOSCOPY N/A 06/01/2014   Procedure: COLONOSCOPY;  Surgeon: Reyes LELON Cota, MD;  Location: [facility] ENDOSCOPY;  Service: Endoscopy;  Laterality: N/A;   DILATION AND CURETTAGE OF UTERUS     tab   UPPER GI ENDOSCOPY  2015   Dr. Viktoria   Family History  Problem Relation Age of Onset   Lung cancer Father 99   Hypertension Mother    Diabetes Mother    Heart disease Mother    Breast cancer Daughter 1       BCRA2 mutation    Diabetes Sister    Cervical cancer Sister    Prostate cancer Maternal Grandfather 67   Diabetes Sister     Cervical cancer Sister 55   Social History   Socioeconomic History   Marital status: Single    Spouse name: Not on file   Number of children: 2   Years of education: Not on file   Highest education level: Associate degree: academic program  Occupational History   Occupation: [name]  Tobacco Use   Smoking status: Never   Smokeless tobacco: Never  Vaping Use   Vaping status: Never Used  Substance and Sexual Activity   Alcohol use: Yes    Alcohol/week: 0.0 standard drinks of alcohol    Comment: occasionally   Drug use: No   Sexual activity: Yes    Partners: Male    Birth control/protection: Post-menopausal  Other Topics Concern   Not on file  Social History Narrative   Not on file   Social Drivers of Health   Financial Resource Strain: Low Risk  (08/06/2023)   Overall Financial Resource Strain (CARDIA)    Difficulty of Paying Living Expenses: Not very hard  Food Insecurity: No Food Insecurity (08/06/2023)   Hunger Vital Sign    Worried About Running Out of Food in the Last Year: Never true    Ran Out of Food in the Last Year: Never true  Transportation Needs: No Transportation Needs (08/06/2023)   PRAPARE - Transportation    Lack  of Transportation (Medical): No    Lack of Transportation (Non-Medical): No  Physical Activity: Inactive (08/06/2023)   Exercise Vital Sign    Days of Exercise per Week: 0 days    Minutes of Exercise per Session: Not on file  Stress: Stress Concern Present (08/06/2023)   [employer] of Occupational Health - Occupational Stress Questionnaire    Feeling of Stress: To some extent  Social Connections: Moderately Isolated (08/06/2023)   Social Connection and Isolation Panel    Frequency of Communication with Friends and Family: More than three times a week    Frequency of Social Gatherings with Friends and Family: More than three times a week    Attends Religious Services: More than 4 times per year    Active Member of  [employer] or Organizations: No    Attends Engineer, structural: Not on file    Marital Status: Never married     Review of Systems  Constitutional:  Negative for appetite change and unexpected weight change.  HENT:  Negative for congestion and sinus pressure.   Respiratory:  Negative for cough, chest tightness and shortness of breath.   Cardiovascular:  Negative for chest pain, palpitations and leg swelling.  Gastrointestinal:  Negative for abdominal pain and vomiting.       Bowel issues as outlined - BRB.  Acid reflux/dysphagia as outlined previously   Genitourinary:  Negative for difficulty urinating and dysuria.  Musculoskeletal:  Negative for joint swelling and myalgias.  Skin:  Negative for color change and rash.  Neurological:  Negative for dizziness and headaches.  Psychiatric/Behavioral:  Negative for agitation and dysphoric mood.        Objective:     BP 120/74   Pulse 84   Resp 16   Ht 5' 3 (1.6 m)   Wt 171 lb (77.6 kg)   LMP 10/10/2016 (Exact Date)   SpO2 98%   BMI 30.29 kg/m  Wt Readings from Last 3 Encounters:  08/07/23 171 lb (77.6 kg)  06/03/23 169 lb (76.7 kg)  03/06/23 165 lb (74.8 kg)    Physical Exam Vitals reviewed.  Constitutional:      General: She is not in acute distress.    Appearance: Normal appearance.  HENT:     Head: Normocephalic and atraumatic.     Right Ear: External ear normal.     Left Ear: External ear normal.     Mouth/Throat:     Pharynx: No oropharyngeal exudate or posterior oropharyngeal erythema.  Eyes:     General: No scleral icterus.       Right eye: No discharge.        Left eye: No discharge.     Conjunctiva/sclera: Conjunctivae normal.  Neck:     Thyroid : No thyromegaly.  Cardiovascular:     Rate and Rhythm: Normal rate and regular rhythm.  Pulmonary:     Effort: No respiratory distress.     Breath sounds: Normal breath sounds. No wheezing.  Abdominal:     General: Bowel sounds are normal.      Palpations: Abdomen is soft.     Tenderness: There is no abdominal tenderness.  Musculoskeletal:        General: No swelling or tenderness.     Cervical back: Neck supple. No tenderness.  Lymphadenopathy:     Cervical: No cervical adenopathy.  Skin:    Findings: No erythema or rash.  Neurological:     Mental Status: She is alert.  Psychiatric:  Mood and Affect: Mood normal.        Behavior: Behavior normal.         Outpatient Encounter Medications as of 08/07/2023  Medication Sig   esomeprazole  (NEXIUM ) 40 MG packet MIX THE CONTENTS OF 1 PACKET  WITH TABLESPOONSFUL WATER WAIT 2 TO 3 MINUTES THEN DRINK DAILY  BEFORE BREAKFAST   pramoxine-hydrocortisone  (PROCTOCREAM-HC) 1-1 % rectal cream Place 1 application rectally 2 (two) times daily.   [EXPIRED] cyanocobalamin  (VITAMIN B12) injection 1,000 mcg    No facility-administered encounter medications on file as of 08/07/2023.     Lab Results  Component Value Date   WBC 4.8 08/07/2023   HGB 11.9 08/07/2023   HCT 36.4 08/07/2023   PLT 238 08/07/2023   GLUCOSE 93 08/05/2023   CHOL 175 08/05/2023   TRIG 99.0 08/05/2023   HDL 58.10 08/05/2023   LDLCALC 98 08/05/2023   ALT 12 08/05/2023   AST 15 08/05/2023   NA 139 08/05/2023   K 4.0 08/05/2023   CL 105 08/05/2023   CREATININE 0.91 08/05/2023   BUN 15 08/05/2023   CO2 28 08/05/2023   TSH 1.480 03/06/2023   HGBA1C 5.4 08/21/2017    MM 3D SCREENING MAMMOGRAM BILATERAL BREAST Result Date: 06/18/2023 CLINICAL DATA:  Screening. EXAM: DIGITAL SCREENING BILATERAL MAMMOGRAM WITH TOMOSYNTHESIS AND CAD TECHNIQUE: Bilateral screening digital craniocaudal and mediolateral oblique mammograms were obtained. Bilateral screening digital breast tomosynthesis was performed. The images were evaluated with computer-aided detection. COMPARISON:  Previous exam(s). ACR Breast Density Category c: The breasts are heterogeneously dense, which may obscure small masses. FINDINGS: There are no  findings suspicious for malignancy. IMPRESSION: No mammographic evidence of malignancy. A result letter of this screening mammogram will be mailed directly to the patient. RECOMMENDATION: Screening mammogram in one year. (Code:SM-B-01Y) BI-RADS CATEGORY  1: Negative. Electronically Signed   By: Debby Satterfield M.D.   On: 06/18/2023 16:26       Assessment & Plan:  Vitamin D  deficiency Assessment & Plan:  Low vitami d level. Vitamin D  3 supplement. Follow.    Rectal bleeding Assessment & Plan: Rectal bleeding as outlined.  Check cbc. No bleeding now. Appt with GI as outlined.   Orders: -     CBC with Differential/Platelet  B12 deficiency Assessment & Plan: Noted on recent labs.  Start B12 injections - 1000mcg q week x 4 weeks and then 1000mcg q month.   Orders: -     Cyanocobalamin   Gastroesophageal reflux disease, unspecified whether esophagitis present Assessment & Plan: Has had issues with persistent acid reflux and dysphagia. Pepcid added last visit. Continue nexium  in am and pepcid in pm as directed. Has appt with GI for f/u.    Hot flashes Assessment & Plan: Have discussed treatment options. Wants to avoid estrogen. Discussed SSRIs, effexor, gabapentin. Notify if desires to start.    Vaginal atrophy Assessment & Plan: Wants to avoid estrogen. Discussed treatment options. Boric acid suppositories.       Allena Hamilton, MD

## 2023-08-08 ENCOUNTER — Ambulatory Visit: Payer: Self-pay | Admitting: Internal Medicine

## 2023-08-08 LAB — CBC WITH DIFFERENTIAL/PLATELET
Basophils Absolute: 0 x10E3/uL (ref 0.0–0.2)
Basos: 1 %
EOS (ABSOLUTE): 0.2 x10E3/uL (ref 0.0–0.4)
Eos: 4 %
Hematocrit: 36.4 % (ref 34.0–46.6)
Hemoglobin: 11.9 g/dL (ref 11.1–15.9)
Immature Grans (Abs): 0 x10E3/uL (ref 0.0–0.1)
Immature Granulocytes: 0 %
Lymphocytes Absolute: 2 x10E3/uL (ref 0.7–3.1)
Lymphs: 42 %
MCH: 30.7 pg (ref 26.6–33.0)
MCHC: 32.7 g/dL (ref 31.5–35.7)
MCV: 94 fL (ref 79–97)
Monocytes Absolute: 0.5 x10E3/uL (ref 0.1–0.9)
Monocytes: 10 %
Neutrophils Absolute: 2.1 x10E3/uL (ref 1.4–7.0)
Neutrophils: 43 %
Platelets: 238 x10E3/uL (ref 150–450)
RBC: 3.88 x10E6/uL (ref 3.77–5.28)
RDW: 12.4 % (ref 11.7–15.4)
WBC: 4.8 x10E3/uL (ref 3.4–10.8)

## 2023-08-16 ENCOUNTER — Encounter: Payer: Self-pay | Admitting: Internal Medicine

## 2023-08-16 DIAGNOSIS — N952 Postmenopausal atrophic vaginitis: Secondary | ICD-10-CM | POA: Insufficient documentation

## 2023-08-16 NOTE — Assessment & Plan Note (Signed)
 Have discussed treatment options. Wants to avoid estrogen. Discussed SSRIs, effexor, gabapentin. Notify if desires to start.

## 2023-08-16 NOTE — Assessment & Plan Note (Signed)
 Rectal bleeding as outlined.  Check cbc. No bleeding now. Appt with GI as outlined.

## 2023-08-16 NOTE — Assessment & Plan Note (Signed)
 Noted on recent labs.  Start B12 injections - 1000mcg q week x 4 weeks and then 1000mcg q month.

## 2023-08-16 NOTE — Assessment & Plan Note (Signed)
 Has had issues with persistent acid reflux and dysphagia. Pepcid added last visit. Continue nexium  in am and pepcid in pm as directed. Has appt with GI for f/u.

## 2023-08-16 NOTE — Assessment & Plan Note (Signed)
 Wants to avoid estrogen. Discussed treatment options. Boric acid suppositories.

## 2023-08-29 ENCOUNTER — Ambulatory Visit: Payer: Self-pay

## 2023-08-29 DIAGNOSIS — R1319 Other dysphagia: Secondary | ICD-10-CM | POA: Diagnosis present

## 2023-08-29 DIAGNOSIS — K219 Gastro-esophageal reflux disease without esophagitis: Secondary | ICD-10-CM | POA: Diagnosis not present

## 2023-08-29 DIAGNOSIS — K222 Esophageal obstruction: Secondary | ICD-10-CM | POA: Diagnosis not present

## 2023-09-05 ENCOUNTER — Encounter: Payer: Self-pay | Admitting: Internal Medicine

## 2023-09-08 ENCOUNTER — Ambulatory Visit

## 2023-09-16 ENCOUNTER — Ambulatory Visit (INDEPENDENT_AMBULATORY_CARE_PROVIDER_SITE_OTHER)

## 2023-09-16 DIAGNOSIS — E538 Deficiency of other specified B group vitamins: Secondary | ICD-10-CM

## 2023-09-16 MED ORDER — CYANOCOBALAMIN 1000 MCG/ML IJ SOLN
1000.0000 ug | Freq: Once | INTRAMUSCULAR | Status: AC
  Administered 2023-09-16: 1000 ug via INTRAMUSCULAR

## 2023-09-16 NOTE — Progress Notes (Signed)
 Patient is in office today for a nurse visit for B12 Injection. Patient Injection was given in the  Right deltoid. Patient tolerated injection well.

## 2023-09-18 ENCOUNTER — Encounter: Payer: Self-pay | Admitting: Internal Medicine

## 2023-10-10 ENCOUNTER — Encounter: Payer: Self-pay | Admitting: Internal Medicine

## 2023-10-10 ENCOUNTER — Ambulatory Visit: Admitting: Internal Medicine

## 2023-10-10 VITALS — BP 124/78 | HR 81 | Temp 97.8°F | Ht 63.0 in | Wt 175.0 lb

## 2023-10-10 DIAGNOSIS — K625 Hemorrhage of anus and rectum: Secondary | ICD-10-CM

## 2023-10-10 DIAGNOSIS — K219 Gastro-esophageal reflux disease without esophagitis: Secondary | ICD-10-CM

## 2023-10-10 DIAGNOSIS — Z13220 Encounter for screening for lipoid disorders: Secondary | ICD-10-CM

## 2023-10-10 DIAGNOSIS — E559 Vitamin D deficiency, unspecified: Secondary | ICD-10-CM | POA: Diagnosis not present

## 2023-10-10 DIAGNOSIS — G479 Sleep disorder, unspecified: Secondary | ICD-10-CM

## 2023-10-10 DIAGNOSIS — R319 Hematuria, unspecified: Secondary | ICD-10-CM

## 2023-10-10 DIAGNOSIS — E538 Deficiency of other specified B group vitamins: Secondary | ICD-10-CM

## 2023-10-10 NOTE — Progress Notes (Signed)
 Subjective:    Patient ID: Sabrina Schneider, female    DOB: Oct 07, 1965, 58 y.o.   MRN: [id number]  Patient here for  Chief Complaint  Patient presents with   Medical Management of Chronic Issues    HPI Here for a scheduled follow up - follow up regarding acid reflux , dysphagia and previous hematuria. Recent renal stone study - negative. Had recommended referral to urology. Wanted to hold on referral. On nexium  for acid reflux. Persistent acid reflux and dysphagia last visit. Pepcid added to take before evening meal. Also referred to GI. Saw GI 07/30/23 - recommended colonoscopy and EGD. Last visit, reported a couple of episodes of rectal bleeding. Had the EGD. S/p dilatation. Swallowing is better. Colonoscopy not performed - pt reported insurance would not cover. No further bleeding. No abdominal pain. Having trouble staying asleep. Also reports right fourth finger - tingling.    Past Medical History:  Diagnosis Date   Anemia    BRCA negative    Family history of breast cancer    daughter had breast cancer at age 69-BRCA positive. Patient is BRCA negative   GERD (gastroesophageal reflux disease)    Hiatal hernia    Past Surgical History:  Procedure Laterality Date   BREAST CYST ASPIRATION  2010/ 2018   Right breast cyst aspiration   CESAREAN SECTION  03/18/2001   fetal indications   COLONOSCOPY N/A 06/01/2014   Procedure: COLONOSCOPY;  Surgeon: Reyes LELON Cota, MD;  Location: [facility] ENDOSCOPY;  Service: Endoscopy;  Laterality: N/A;   DILATION AND CURETTAGE OF UTERUS     tab   UPPER GI ENDOSCOPY  2015   Dr. Viktoria   Family History  Problem Relation Age of Onset   Lung cancer Father 77   Hypertension Mother    Diabetes Mother    Heart disease Mother    Breast cancer Daughter 61       BCRA2 mutation    Diabetes Sister    Cervical cancer Sister    Prostate cancer Maternal Grandfather 60   Diabetes Sister    Cervical cancer Sister 108   ADD / ADHD Sister    Social History    Socioeconomic History   Marital status: Single    Spouse name: Not on file   Number of children: 2   Years of education: Not on file   Highest education level: Associate degree: academic program  Occupational History   Occupation: [name]  Tobacco Use   Smoking status: Never   Smokeless tobacco: Never  Vaping Use   Vaping status: Never Used  Substance and Sexual Activity   Alcohol use: Yes    Alcohol/week: 0.0 standard drinks of alcohol    Comment: occasionally   Drug use: No   Sexual activity: Yes    Partners: Male    Birth control/protection: Post-menopausal  Other Topics Concern   Not on file  Social History Narrative   Not on file   Social Drivers of Health   Financial Resource Strain: Low Risk  (08/06/2023)   Overall Financial Resource Strain (CARDIA)    Difficulty of Paying Living Expenses: Not very hard  Food Insecurity: No Food Insecurity (08/06/2023)   Hunger Vital Sign    Worried About Running Out of Food in the Last Year: Never true    Ran Out of Food in the Last Year: Never true  Transportation Needs: No Transportation Needs (08/06/2023)   PRAPARE - Transportation    Lack of Transportation (  Medical): No    Lack of Transportation (Non-Medical): No  Physical Activity: Inactive (08/06/2023)   Exercise Vital Sign    Days of Exercise per Week: 0 days    Minutes of Exercise per Session: Not on file  Stress: Stress Concern Present (08/06/2023)   [employer] of Occupational Health - Occupational Stress Questionnaire    Feeling of Stress: To some extent  Social Connections: Moderately Isolated (08/06/2023)   Social Connection and Isolation Panel    Frequency of Communication with Friends and Family: More than three times a week    Frequency of Social Gatherings with Friends and Family: More than three times a week    Attends Religious Services: More than 4 times per year    Active Member of [employer] or Organizations: No    Attends Museum/gallery exhibitions officer: Not on file    Marital Status: Never married     Review of Systems  Constitutional:  Negative for appetite change and unexpected weight change.  HENT:  Negative for congestion and sinus pressure.   Respiratory:  Negative for cough, chest tightness and shortness of breath.   Cardiovascular:  Negative for chest pain, palpitations and leg swelling.  Gastrointestinal:  Negative for abdominal pain, diarrhea, nausea and vomiting.  Genitourinary:  Negative for difficulty urinating and dysuria.  Musculoskeletal:  Negative for joint swelling and myalgias.  Skin:  Negative for color change and rash.  Neurological:  Negative for dizziness and headaches.  Psychiatric/Behavioral:  Negative for agitation and dysphoric mood.        Objective:     BP 124/78   Pulse 81   Temp 97.8 F (36.6 C) (Oral)   Ht 5' 3 (1.6 m)   Wt 175 lb (79.4 kg)   LMP 10/10/2016 (Exact Date)   SpO2 96%   BMI 31.00 kg/m  Wt Readings from Last 3 Encounters:  10/10/23 175 lb (79.4 kg)  08/07/23 171 lb (77.6 kg)  06/03/23 169 lb (76.7 kg)    Physical Exam Vitals reviewed.  Constitutional:      General: She is not in acute distress.    Appearance: Normal appearance.  HENT:     Head: Normocephalic and atraumatic.     Right Ear: External ear normal.     Left Ear: External ear normal.     Mouth/Throat:     Pharynx: No oropharyngeal exudate or posterior oropharyngeal erythema.  Eyes:     General: No scleral icterus.       Right eye: No discharge.        Left eye: No discharge.     Conjunctiva/sclera: Conjunctivae normal.  Neck:     Thyroid : No thyromegaly.  Cardiovascular:     Rate and Rhythm: Normal rate and regular rhythm.  Pulmonary:     Effort: No respiratory distress.     Breath sounds: Normal breath sounds. No wheezing.  Abdominal:     General: Bowel sounds are normal.     Palpations: Abdomen is soft.     Tenderness: There is no abdominal tenderness.  Musculoskeletal:         General: No swelling or tenderness.     Cervical back: Neck supple. No tenderness.  Lymphadenopathy:     Cervical: No cervical adenopathy.  Skin:    Findings: No erythema or rash.  Neurological:     Mental Status: She is alert.  Psychiatric:        Mood and Affect: Mood normal.  Behavior: Behavior normal.         Outpatient Encounter Medications as of 10/10/2023  Medication Sig   esomeprazole  (NEXIUM ) 40 MG packet MIX THE CONTENTS OF 1 PACKET  WITH TABLESPOONSFUL WATER WAIT 2 TO 3 MINUTES THEN DRINK DAILY  BEFORE BREAKFAST   pramoxine-hydrocortisone  (PROCTOCREAM-HC) 1-1 % rectal cream Place 1 application rectally 2 (two) times daily.   No facility-administered encounter medications on file as of 10/10/2023.     Lab Results  Component Value Date   WBC 4.8 08/07/2023   HGB 11.9 08/07/2023   HCT 36.4 08/07/2023   PLT 238 08/07/2023   GLUCOSE 93 08/05/2023   CHOL 175 08/05/2023   TRIG 99.0 08/05/2023   HDL 58.10 08/05/2023   LDLCALC 98 08/05/2023   ALT 12 08/05/2023   AST 15 08/05/2023   NA 139 08/05/2023   K 4.0 08/05/2023   CL 105 08/05/2023   CREATININE 0.91 08/05/2023   BUN 15 08/05/2023   CO2 28 08/05/2023   TSH 1.480 03/06/2023   HGBA1C 5.4 08/21/2017    MM 3D SCREENING MAMMOGRAM BILATERAL BREAST Result Date: 06/18/2023 CLINICAL DATA:  Screening. EXAM: DIGITAL SCREENING BILATERAL MAMMOGRAM WITH TOMOSYNTHESIS AND CAD TECHNIQUE: Bilateral screening digital craniocaudal and mediolateral oblique mammograms were obtained. Bilateral screening digital breast tomosynthesis was performed. The images were evaluated with computer-aided detection. COMPARISON:  Previous exam(s). ACR Breast Density Category c: The breasts are heterogeneously dense, which may obscure small masses. FINDINGS: There are no findings suspicious for malignancy. IMPRESSION: No mammographic evidence of malignancy. A result letter of this screening mammogram will be mailed directly to the patient.  RECOMMENDATION: Screening mammogram in one year. (Code:SM-B-01Y) BI-RADS CATEGORY  1: Negative. Electronically Signed   By: Debby Satterfield M.D.   On: 06/18/2023 16:26       Assessment & Plan:  Rectal bleeding Assessment & Plan:  Saw GI 07/30/23 - recommended colonoscopy and EGD. Last visit, reported a couple of episodes of rectal bleeding. Had the EGD. S/p dilatation. Swallowing is better. Colonoscopy not performed - pt reported insurance would not cover. No further bleeding.    Vitamin D  deficiency Assessment & Plan:  Check vitamin D  level.    Screening cholesterol level  Gastroesophageal reflux disease, unspecified whether esophagitis present Assessment & Plan: On nexium  for acid reflux. Persistent acid reflux and dysphagia last visit. Pepcid added to take before evening meal. Also referred to GI. Saw GI 07/30/23 - recommended colonoscopy and EGD.  Had the EGD. S/p dilatation. Swallowing is better.    B12 deficiency Assessment & Plan: Continue B12 injections.    Hematuria, unspecified type Assessment & Plan: Recheck urine today. Previuos red blood cells. CT unrevealing. Wanted to hold on urology referral. Urinalysis 05/2023 - no red blood cells.    Sleep difficulties Assessment & Plan: Trouble staying asleep as outlined. Discussed behavior modification. Discussed melatonin. Follow.       Allena Hamilton, MD

## 2023-10-10 NOTE — Assessment & Plan Note (Addendum)
 Check vitamin D  level

## 2023-10-13 ENCOUNTER — Encounter: Payer: Self-pay | Admitting: Internal Medicine

## 2023-10-13 DIAGNOSIS — G479 Sleep disorder, unspecified: Secondary | ICD-10-CM | POA: Insufficient documentation

## 2023-10-13 NOTE — Assessment & Plan Note (Signed)
 Recheck urine today. Previuos red blood cells. CT unrevealing. Wanted to hold on urology referral. Urinalysis 05/2023 - no red blood cells.

## 2023-10-13 NOTE — Assessment & Plan Note (Signed)
 Continue B12 injections.

## 2023-10-13 NOTE — Assessment & Plan Note (Signed)
 On nexium  for acid reflux. Persistent acid reflux and dysphagia last visit. Pepcid added to take before evening meal. Also referred to GI. Saw GI 07/30/23 - recommended colonoscopy and EGD.  Had the EGD. S/p dilatation. Swallowing is better.

## 2023-10-13 NOTE — Assessment & Plan Note (Signed)
 Trouble staying asleep as outlined. Discussed behavior modification. Discussed melatonin. Follow.

## 2023-10-13 NOTE — Assessment & Plan Note (Signed)
 Saw GI 07/30/23 - recommended colonoscopy and EGD. Last visit, reported a couple of episodes of rectal bleeding. Had the EGD. S/p dilatation. Swallowing is better. Colonoscopy not performed - pt reported insurance would not cover. No further bleeding.

## 2023-10-16 ENCOUNTER — Ambulatory Visit

## 2023-12-27 IMAGING — MG MM DIGITAL SCREENING BILAT W/ TOMO AND CAD
6 of 12 series · 6 of 36 positions shown · non-contrast
Comparison: Previous exam(s).

CLINICAL DATA: Screening.

EXAM:
DIGITAL SCREENING BILATERAL MAMMOGRAM WITH TOMOSYNTHESIS AND CAD
TECHNIQUE: Bilateral screening digital craniocaudal and mediolateral oblique
mammograms were obtained. Bilateral screening digital breast
tomosynthesis was performed. The images were evaluated with
computer-aided detection.

[L CC synth-2D]
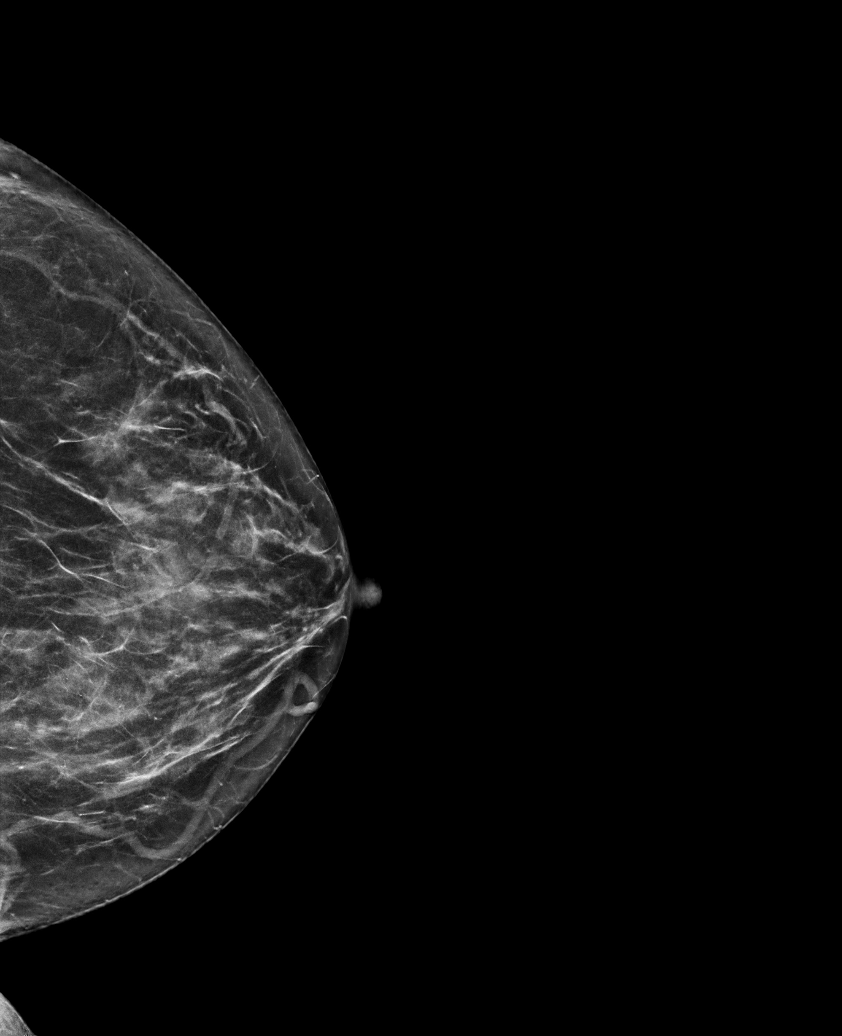

[R MLO synth-2D (1 of 2)]
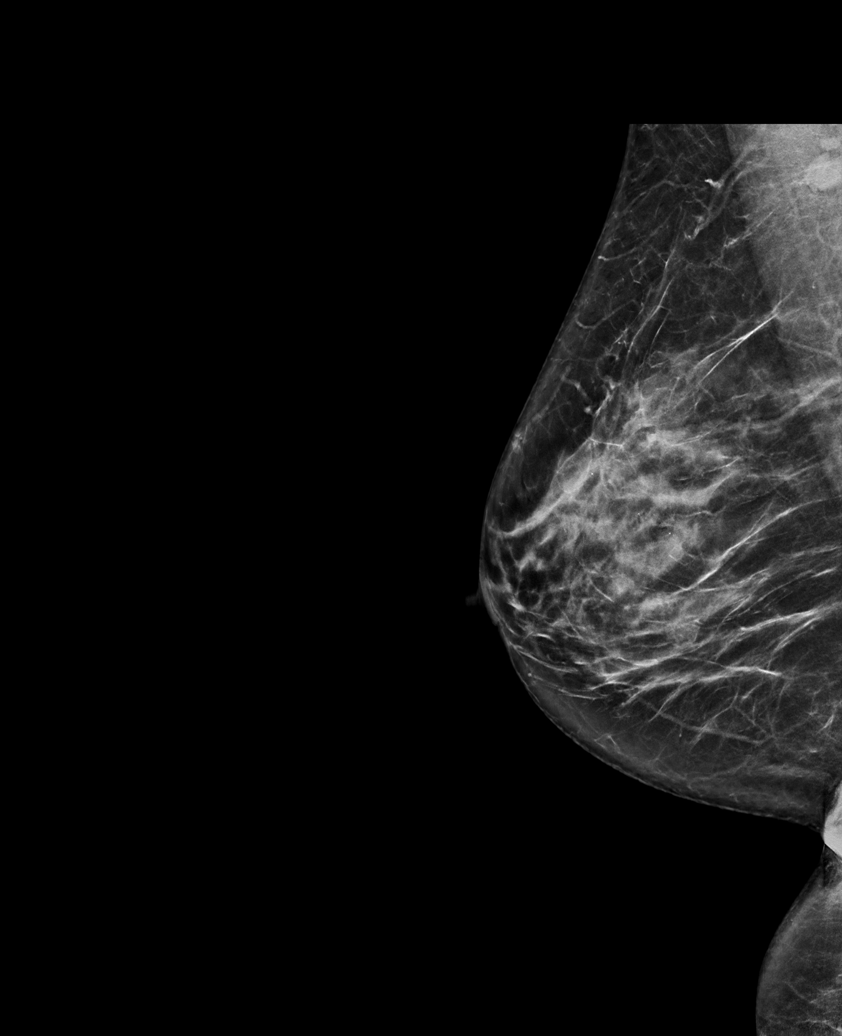

[R MLO synth-2D (2 of 2)]
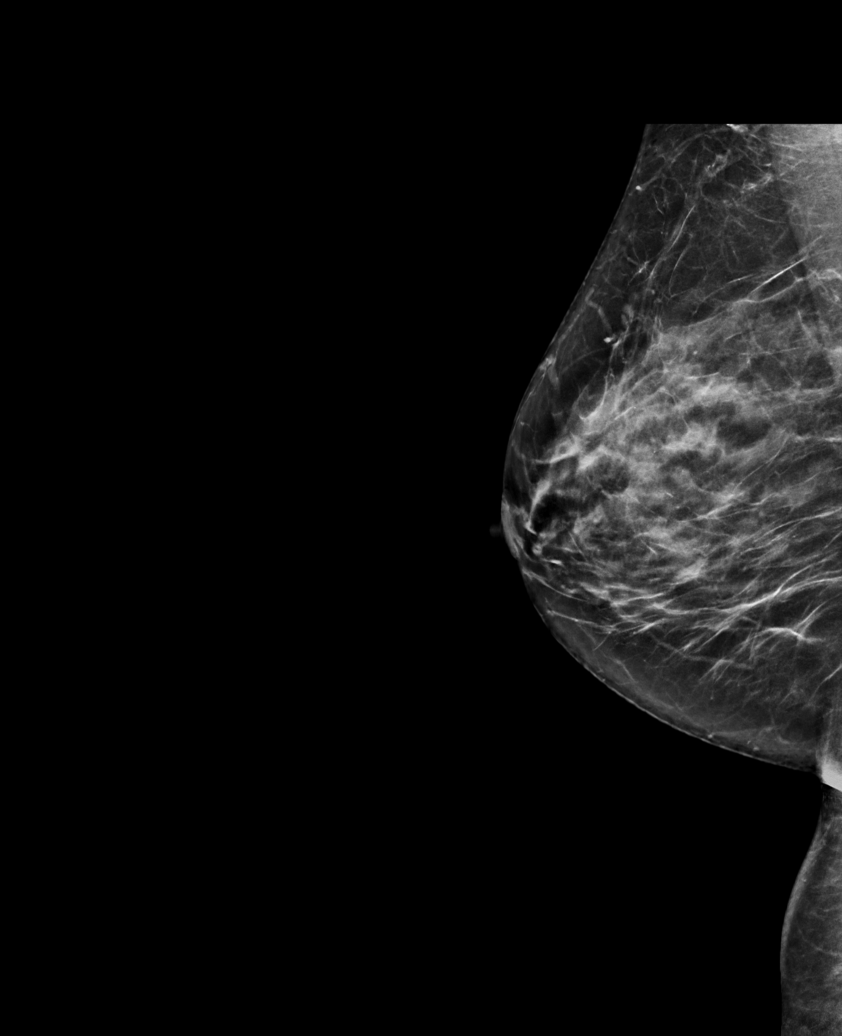

[L MLO synth-2D (1 of 2)]
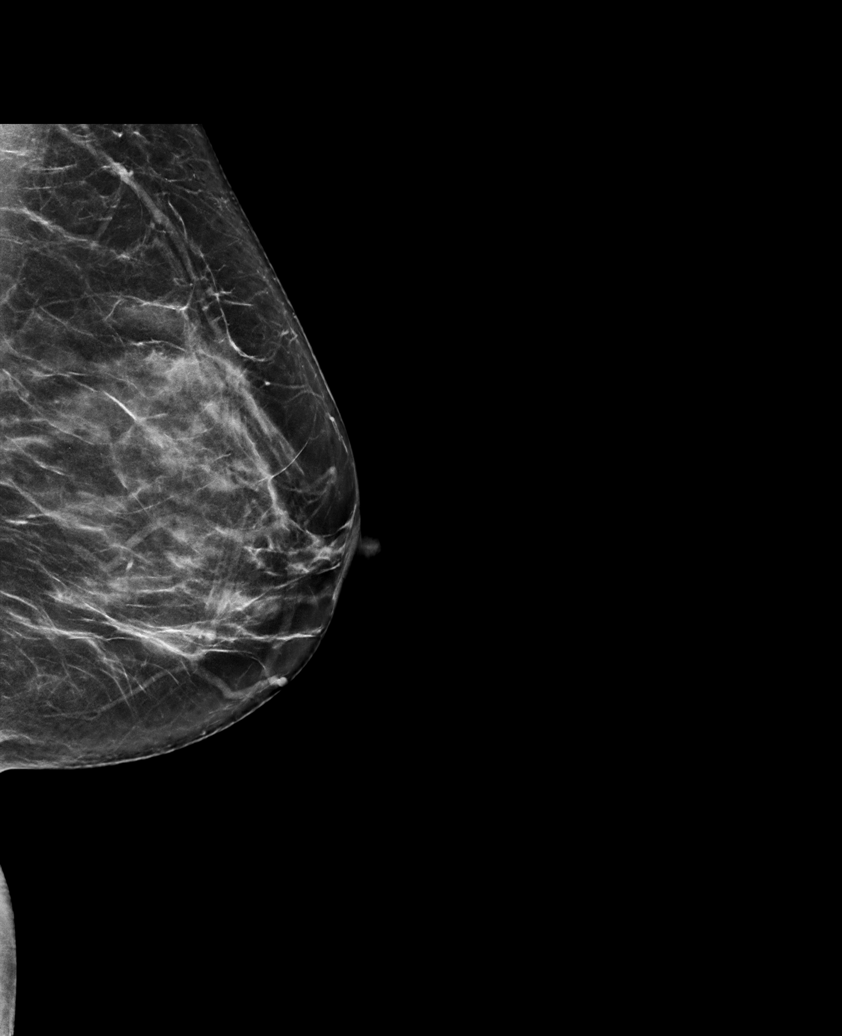

[L MLO synth-2D (2 of 2)]
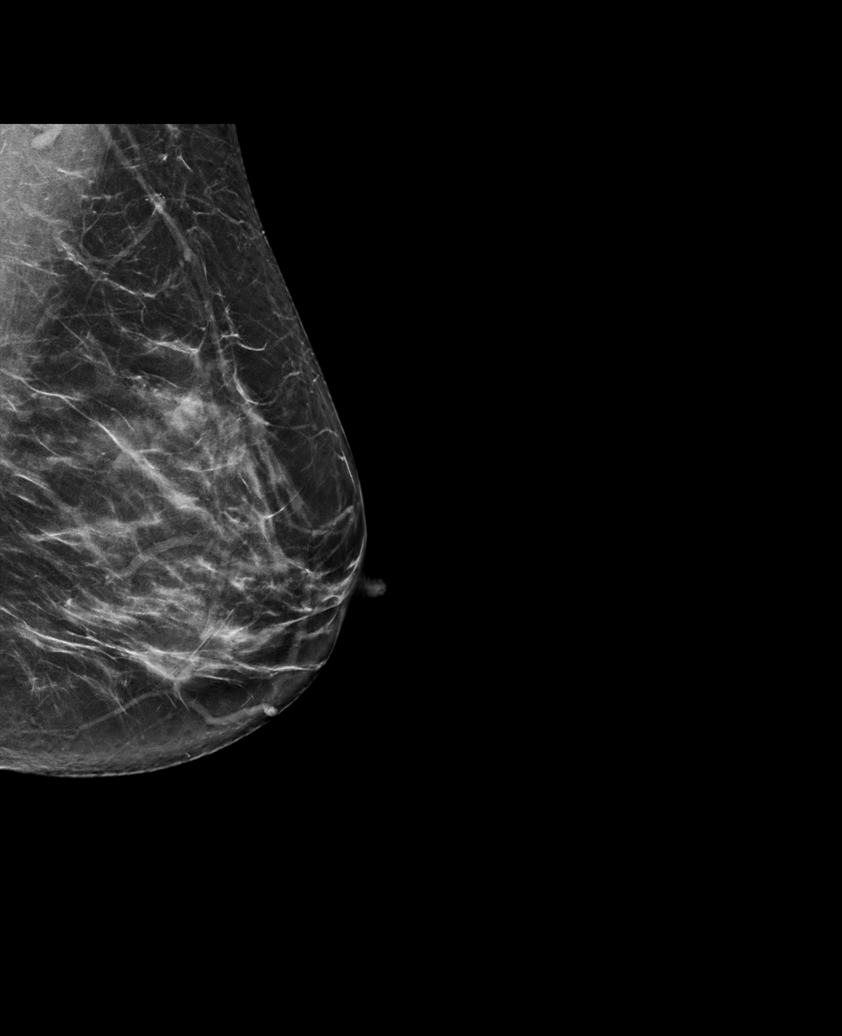

[R CC synth-2D]
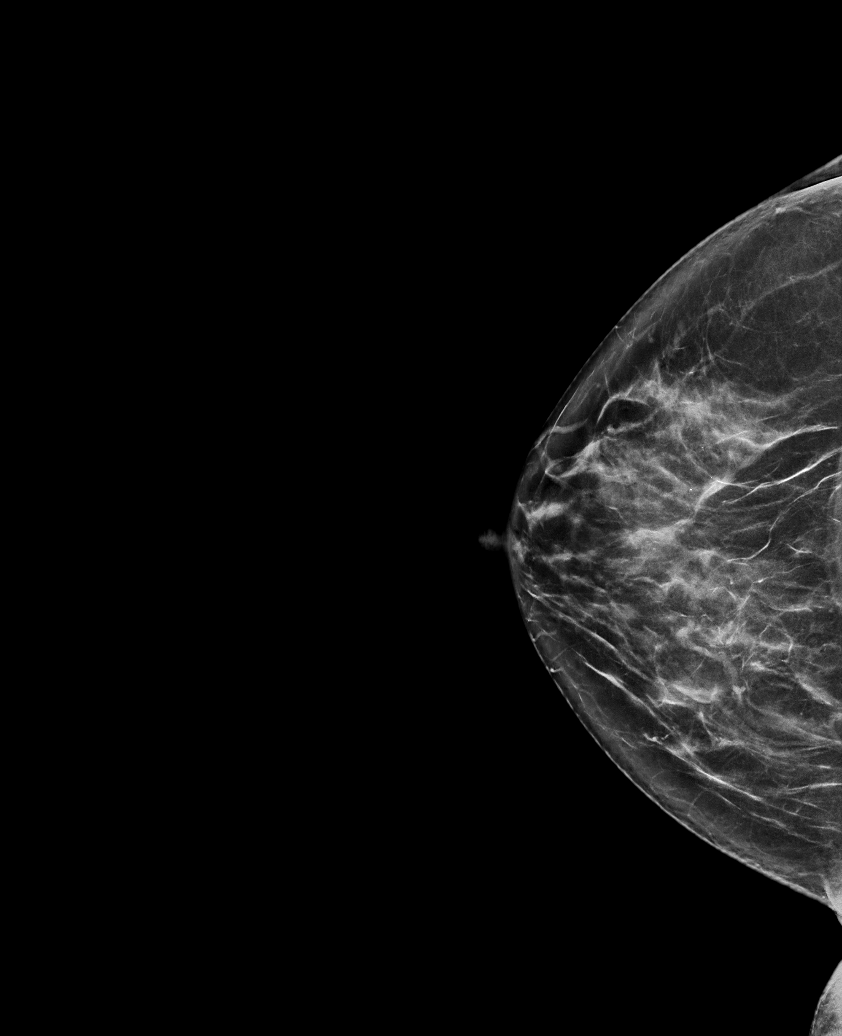

[6 of 36 positions shown; findings below may reference images not displayed]

ACR Breast Density Category c: The breast tissue is heterogeneously
dense, which may obscure small masses.
FINDINGS: There are no findings suspicious for malignancy.
IMPRESSION: No mammographic evidence of malignancy. A result letter of this
screening mammogram will be mailed directly to the patient.

RECOMMENDATION:
Screening mammogram in one year. (Code:Q3-W-BC3)

BI-RADS CATEGORY  1: Negative.

## 2024-01-19 ENCOUNTER — Ambulatory Visit: Admitting: Internal Medicine

## 2024-01-19 VITALS — BP 120/76 | HR 81 | Temp 98.0°F | Ht 63.0 in | Wt 172.0 lb

## 2024-01-19 DIAGNOSIS — K219 Gastro-esophageal reflux disease without esophagitis: Secondary | ICD-10-CM | POA: Diagnosis not present

## 2024-01-19 DIAGNOSIS — E559 Vitamin D deficiency, unspecified: Secondary | ICD-10-CM | POA: Diagnosis not present

## 2024-01-19 DIAGNOSIS — K625 Hemorrhage of anus and rectum: Secondary | ICD-10-CM

## 2024-01-19 DIAGNOSIS — G479 Sleep disorder, unspecified: Secondary | ICD-10-CM | POA: Diagnosis not present

## 2024-01-19 DIAGNOSIS — R109 Unspecified abdominal pain: Secondary | ICD-10-CM

## 2024-01-19 DIAGNOSIS — R319 Hematuria, unspecified: Secondary | ICD-10-CM

## 2024-01-19 DIAGNOSIS — Z13220 Encounter for screening for lipoid disorders: Secondary | ICD-10-CM

## 2024-01-19 DIAGNOSIS — E538 Deficiency of other specified B group vitamins: Secondary | ICD-10-CM

## 2024-01-19 DIAGNOSIS — R053 Chronic cough: Secondary | ICD-10-CM | POA: Diagnosis not present

## 2024-01-19 MED ORDER — CEFDINIR 300 MG PO CAPS: 300.0000 mg | ORAL_CAPSULE | Freq: Two times a day (BID) | ORAL | 0 refills | Status: AC

## 2024-01-19 MED ORDER — PREDNISONE 10 MG PO TABS: ORAL_TABLET | ORAL | 0 refills | Status: AC

## 2024-01-19 NOTE — Assessment & Plan Note (Signed)
 Check vitamin D  level today. Low last check.

## 2024-01-19 NOTE — Progress Notes (Signed)
 "  Subjective:    Patient ID: Sabrina Schneider, female    DOB: 10-10-1965, 59 y.o.   MRN: [id number]  Patient here for  Chief Complaint  Patient presents with   Medical Management of Chronic Issues   Nasal Congestion    HPI Here for a scheduled follow up - follow up regarding acid reflux , dysphagia and previous hematuria. Recent renal stone study - negative. Had recommended referral to urology. Wanted to hold on referral. On nexium  for acid reflux. Pepcid previously added to take before evening meal. Also referred to GI. Saw GI 07/30/23 - recommended colonoscopy and EGD. Had previously also reported a couple of episodes of rectal bleeding. Had the EGD. S/p dilatation. Swallowing is better. Colonoscopy not performed - pt reported insurance would not cover. reported no further bleeding. Due this year for colonoscopy. Continues on B12 injection. She is taking nexium  in am. Taking pepcid in pm - prn. Not taking regularly. If takes both regularly - symptoms appear to be under reasonable control. She does report persistent cough and congestion. Increased head and nasal congestion. Worse over the last several weeks. Some colored mucus and has noticed intermittent blood tinged mucus. Increased post nasal drainage. Some sneezing. Increased cough - occasionally productive. Coughing fits. Using flonase intermittently.    Past Medical History:  Diagnosis Date   Anemia    BRCA negative    Family history of breast cancer    daughter had breast cancer at age 32-BRCA positive. Patient is BRCA negative   GERD (gastroesophageal reflux disease)    Hiatal hernia    Past Surgical History:  Procedure Laterality Date   BREAST CYST ASPIRATION  2010/ 2018   Right breast cyst aspiration   CESAREAN SECTION  03/18/2001   fetal indications   COLONOSCOPY N/A 06/01/2014   Procedure: COLONOSCOPY;  Surgeon: Reyes LELON Cota, MD;  Location: [facility] ENDOSCOPY;  Service: Endoscopy;  Laterality: N/A;   DILATION AND CURETTAGE OF  UTERUS     tab   UPPER GI ENDOSCOPY  2015   Dr. Viktoria   Family History  Problem Relation Age of Onset   Lung cancer Father 59   Hypertension Mother    Diabetes Mother    Heart disease Mother    Breast cancer Daughter 25       BCRA2 mutation    Diabetes Sister    Cervical cancer Sister    Prostate cancer Maternal Grandfather 49   Diabetes Sister    Cervical cancer Sister 53   ADD / ADHD Sister    Social History   Socioeconomic History   Marital status: Single    Spouse name: Not on file   Number of children: 2   Years of education: Not on file   Highest education level: Associate degree: academic program  Occupational History   Occupation: [name]  Tobacco Use   Smoking status: Never   Smokeless tobacco: Never  Vaping Use   Vaping status: Never Used  Substance and Sexual Activity   Alcohol use: Yes    Alcohol/week: 0.0 standard drinks of alcohol    Comment: occasionally   Drug use: No   Sexual activity: Yes    Partners: Male    Birth control/protection: Post-menopausal  Other Topics Concern   Not on file  Social History Narrative   Not on file   Social Drivers of Health   Tobacco Use: Low Risk (01/25/2024)   Patient History    Smoking Tobacco Use: Never  Smokeless Tobacco Use: Never    Passive Exposure: Not on file  Financial Resource Strain: Low Risk (01/19/2024)   Overall Financial Resource Strain (CARDIA)    Difficulty of Paying Living Expenses: Not hard at all  Food Insecurity: No Food Insecurity (01/19/2024)   Epic    Worried About Programme Researcher, Broadcasting/film/video in the Last Year: Never true    Ran Out of Food in the Last Year: Never true  Transportation Needs: No Transportation Needs (01/19/2024)   Epic    Lack of Transportation (Medical): No    Lack of Transportation (Non-Medical): No  Physical Activity: Inactive (01/19/2024)   Exercise Vital Sign    Days of Exercise per Week: 0 days    Minutes of Exercise per Session: Not on file   Stress: No Stress Concern Present (01/19/2024)   [employer] of Occupational Health - Occupational Stress Questionnaire    Feeling of Stress: Only a little  Social Connections: Moderately Isolated (01/19/2024)   Social Connection and Isolation Panel    Frequency of Communication with Friends and Family: Patient declined    Frequency of Social Gatherings with Friends and Family: More than three times a week    Attends Religious Services: More than 4 times per year    Active Member of Clubs or Organizations: No    Attends Banker Meetings: Not on file    Marital Status: Never married  Depression (PHQ2-9): Medium Risk (10/10/2023)   Depression (PHQ2-9)    PHQ-2 Score: 5  Alcohol Screen: Low Risk (01/19/2024)   Alcohol Screen    Last Alcohol Screening Score (AUDIT): 1  Housing: Unknown (01/19/2024)   Epic    Unable to Pay for Housing in the Last Year: No    Number of Times Moved in the Last Year: Not on file    Homeless in the Last Year: No  Utilities: Not At Risk (07/30/2023)   Received from [facility]   Epic    In the past 12 months has the electric, gas, oil, or water company threatened to shut off services in your home?: No  Health Literacy: Not on file     Review of Systems  Constitutional:  Negative for appetite change and fever.  HENT:  Positive for congestion and postnasal drip. Negative for sore throat.   Respiratory:  Positive for cough. Negative for chest tightness.        No increased sob.   Cardiovascular:  Negative for chest pain, palpitations and leg swelling.  Gastrointestinal:  Negative for abdominal pain, diarrhea, nausea and vomiting.       Acid reflux as outlined.   Genitourinary:  Negative for difficulty urinating and dysuria.       Some stress incontinence.   Musculoskeletal:  Negative for joint swelling and myalgias.  Skin:  Negative for color change and rash.  Neurological:  Negative for dizziness and headaches.   Psychiatric/Behavioral:  Negative for agitation and dysphoric mood.        Objective:     BP 120/76   Pulse 81   Temp 98 F (36.7 C) (Oral)   Ht 5' 3 (1.6 m)   Wt 172 lb (78 kg)   LMP 10/10/2016   SpO2 96%   BMI 30.47 kg/m  Wt Readings from Last 3 Encounters:  01/19/24 172 lb (78 kg)  10/10/23 175 lb (79.4 kg)  08/07/23 171 lb (77.6 kg)    Physical Exam Vitals reviewed.  Constitutional:  General: She is not in acute distress.    Appearance: Normal appearance.  HENT:     Head: Normocephalic and atraumatic.     Right Ear: External ear normal.     Left Ear: External ear normal.  Eyes:     General: No scleral icterus.       Right eye: No discharge.        Left eye: No discharge.     Conjunctiva/sclera: Conjunctivae normal.  Neck:     Thyroid : No thyromegaly.  Cardiovascular:     Rate and Rhythm: Normal rate and regular rhythm.  Pulmonary:     Breath sounds: Normal breath sounds. No wheezing.     Comments: Increased cough with forced expiration.  Abdominal:     General: Bowel sounds are normal.     Palpations: Abdomen is soft.     Tenderness: There is no abdominal tenderness.  Musculoskeletal:        General: No swelling or tenderness.     Cervical back: Neck supple. No tenderness.  Lymphadenopathy:     Cervical: No cervical adenopathy.  Skin:    Findings: No erythema or rash.  Neurological:     Mental Status: She is alert.  Psychiatric:        Mood and Affect: Mood normal.        Behavior: Behavior normal.         Outpatient Encounter Medications as of 01/19/2024  Medication Sig   cefdinir  (OMNICEF ) 300 MG capsule Take 1 capsule (300 mg total) by mouth 2 (two) times daily.   esomeprazole  (NEXIUM ) 40 MG packet MIX THE CONTENTS OF 1 PACKET  WITH TABLESPOONSFUL WATER WAIT 2 TO 3 MINUTES THEN DRINK DAILY  BEFORE BREAKFAST   predniSONE  (DELTASONE ) 10 MG tablet Take 4 tablets x 1 day and then decrease by 1/2 tablet per day until down to zero mg.   No  facility-administered encounter medications on file as of 01/19/2024.     Lab Results  Component Value Date   WBC 5.5 01/19/2024   HGB 12.8 01/19/2024   HCT 39.2 01/19/2024   PLT 268 01/19/2024   GLUCOSE 94 01/19/2024   CHOL 178 01/19/2024   TRIG 84 01/19/2024   HDL 57 01/19/2024   LDLCALC 105 (H) 01/19/2024   ALT 17 01/19/2024   AST 18 01/19/2024   NA 139 01/19/2024   K 4.2 01/19/2024   CL 103 01/19/2024   CREATININE 0.97 01/19/2024   BUN 14 01/19/2024   CO2 24 01/19/2024   TSH 1.480 03/06/2023   HGBA1C 5.4 08/21/2017    MM 3D SCREENING MAMMOGRAM BILATERAL BREAST Result Date: 06/18/2023 CLINICAL DATA:  Screening. EXAM: DIGITAL SCREENING BILATERAL MAMMOGRAM WITH TOMOSYNTHESIS AND CAD TECHNIQUE: Bilateral screening digital craniocaudal and mediolateral oblique mammograms were obtained. Bilateral screening digital breast tomosynthesis was performed. The images were evaluated with computer-aided detection. COMPARISON:  Previous exam(s). ACR Breast Density Category c: The breasts are heterogeneously dense, which may obscure small masses. FINDINGS: There are no findings suspicious for malignancy. IMPRESSION: No mammographic evidence of malignancy. A result letter of this screening mammogram will be mailed directly to the patient. RECOMMENDATION: Screening mammogram in one year. (Code:SM-B-01Y) BI-RADS CATEGORY  1: Negative. Electronically Signed   By: Debby Satterfield M.D.   On: 06/18/2023 16:26       Assessment & Plan:  Sleep difficulties Assessment & Plan: Reported sleep issues last visit. Discussed melatonin.    Rectal bleeding Assessment & Plan:  Saw GI 07/30/23 - recommended  colonoscopy and EGD. Last visit, reported a couple of episodes of rectal bleeding. Had the EGD. S/p dilatation. Swallowing is better. Colonoscopy not performed - pt reported insurance would not cover. No further bleeding. Due this year for colonoscopy.    Vitamin D  deficiency Assessment & Plan: Check  vitamin D  level today. Low last check.   Orders: -     VITAMIN D  25 Hydroxy (Vit-D Deficiency, Fractures)  Screening cholesterol level -     Lipid panel  Gastroesophageal reflux disease, unspecified whether esophagitis present Assessment & Plan:  Saw GI 07/30/23 - recommended colonoscopy and EGD. Had previously also reported a couple of episodes of rectal bleeding. Had the EGD. S/p dilatation. Swallowing is better. She is taking nexium  in am. Taking pepcid in pm - prn. Not taking regularly. If takes both regularly - symptoms appear to be under reasonable control. Take as directed on a regular schedule.   Orders: -     CBC with Differential/Platelet -     Comprehensive metabolic panel with GFR  Persistent cough Assessment & Plan: Persistent cough and congestion as outlined. Worse recently. Also now with increased colored mucus and worsening symptom as outlined. Had cxr 05/2023. Given acute worsening, will cover for bacterial infection. Treat with omnicef  as directed. Prednsione taper. Continue flonase and saline nasal spray. Has had persistent issues with cough. Refer to pulmonary for further evaluation/testing. Treat acid reflux as outlined.   Orders: -     Pulmonary Visit  Hematuria, unspecified type Assessment & Plan: Previous hematuria. Recent renal stone study - negative. Had recommended referral to urology. Had wanted to hold on referral.    B12 deficiency Assessment & Plan: Continue B12 injections.    Other orders -     Cefdinir ; Take 1 capsule (300 mg total) by mouth 2 (two) times daily.  Dispense: 14 capsule; Refill: 0 -     predniSONE ; Take 4 tablets x 1 day and then decrease by 1/2 tablet per day until down to zero mg.  Dispense: 18 tablet; Refill: 0     Allena Hamilton, MD "

## 2024-01-19 NOTE — Assessment & Plan Note (Signed)
 Reported sleep issues last visit. Discussed melatonin.

## 2024-01-20 LAB — CBC WITH DIFFERENTIAL/PLATELET
Basophils Absolute: 0 x10E3/uL (ref 0.0–0.2)
Basos: 1 %
EOS (ABSOLUTE): 0.6 x10E3/uL — ABNORMAL HIGH (ref 0.0–0.4)
Eos: 10 %
Hematocrit: 39.2 % (ref 34.0–46.6)
Hemoglobin: 12.8 g/dL (ref 11.1–15.9)
Immature Grans (Abs): 0 x10E3/uL (ref 0.0–0.1)
Immature Granulocytes: 0 %
Lymphocytes Absolute: 1.5 x10E3/uL (ref 0.7–3.1)
Lymphs: 28 %
MCH: 30.7 pg (ref 26.6–33.0)
MCHC: 32.7 g/dL (ref 31.5–35.7)
MCV: 94 fL (ref 79–97)
Monocytes Absolute: 0.4 x10E3/uL (ref 0.1–0.9)
Monocytes: 8 %
Neutrophils Absolute: 3 x10E3/uL (ref 1.4–7.0)
Neutrophils: 53 %
Platelets: 268 x10E3/uL (ref 150–450)
RBC: 4.17 x10E6/uL (ref 3.77–5.28)
RDW: 12.3 % (ref 11.7–15.4)
WBC: 5.5 x10E3/uL (ref 3.4–10.8)

## 2024-01-20 LAB — COMPREHENSIVE METABOLIC PANEL WITH GFR
ALT: 17 IU/L (ref 0–32)
AST: 18 IU/L (ref 0–40)
Albumin: 4.1 g/dL (ref 3.8–4.9)
Alkaline Phosphatase: 131 IU/L (ref 49–135)
BUN/Creatinine Ratio: 14 (ref 12–28)
BUN: 14 mg/dL (ref 8–27)
Bilirubin Total: 0.6 mg/dL (ref 0.0–1.2)
CO2: 24 mmol/L (ref 20–29)
Calcium: 8.9 mg/dL (ref 8.7–10.3)
Chloride: 103 mmol/L (ref 96–106)
Creatinine, Ser: 0.97 mg/dL (ref 0.57–1.00)
Globulin, Total: 3 g/dL (ref 1.5–4.5)
Glucose: 94 mg/dL (ref 70–99)
Potassium: 4.2 mmol/L (ref 3.5–5.2)
Sodium: 139 mmol/L (ref 134–144)
Total Protein: 7.1 g/dL (ref 6.0–8.5)
eGFR: 67 mL/min/1.73

## 2024-01-20 LAB — LIPID PANEL
Chol/HDL Ratio: 3.1 ratio (ref 0.0–4.4)
Cholesterol, Total: 178 mg/dL (ref 100–199)
HDL: 57 mg/dL
LDL Chol Calc (NIH): 105 mg/dL — ABNORMAL HIGH (ref 0–99)
Triglycerides: 84 mg/dL (ref 0–149)
VLDL Cholesterol Cal: 16 mg/dL (ref 5–40)

## 2024-01-20 LAB — VITAMIN D 25 HYDROXY (VIT D DEFICIENCY, FRACTURES): Vit D, 25-Hydroxy: 17 ng/mL — ABNORMAL LOW (ref 30.0–100.0)

## 2024-01-21 ENCOUNTER — Ambulatory Visit: Payer: Self-pay | Admitting: Internal Medicine

## 2024-01-22 MED ORDER — VITAMIN D (ERGOCALCIFEROL) 1.25 MG (50000 UNIT) PO CAPS: 50000.0000 [IU] | ORAL_CAPSULE | ORAL | 1 refills | Status: AC

## 2024-01-22 NOTE — Telephone Encounter (Signed)
 Rx sent in for ergocalciferol .

## 2024-01-25 ENCOUNTER — Encounter: Payer: Self-pay | Admitting: Internal Medicine

## 2024-01-25 NOTE — Assessment & Plan Note (Signed)
"   Saw GI 07/30/23 - recommended colonoscopy and EGD. Had previously also reported a couple of episodes of rectal bleeding. Had the EGD. S/p dilatation. Swallowing is better. She is taking nexium  in am. Taking pepcid in pm - prn. Not taking regularly. If takes both regularly - symptoms appear to be under reasonable control. Take as directed on a regular schedule.  "

## 2024-01-25 NOTE — Assessment & Plan Note (Addendum)
 Previous hematuria. Recent renal stone study - negative. Had recommended referral to urology. Had wanted to hold on referral.

## 2024-01-25 NOTE — Assessment & Plan Note (Addendum)
 Persistent cough and congestion as outlined. Worse recently. Also now with increased colored mucus and worsening symptom as outlined. Had cxr 05/2023. Given acute worsening, will cover for bacterial infection. Treat with omnicef  as directed. Prednsione taper. Continue flonase and saline nasal spray. Has had persistent issues with cough. Refer to pulmonary for further evaluation/testing. Treat acid reflux as outlined.

## 2024-01-25 NOTE — Assessment & Plan Note (Signed)
"   Saw GI 07/30/23 - recommended colonoscopy and EGD. Last visit, reported a couple of episodes of rectal bleeding. Had the EGD. S/p dilatation. Swallowing is better. Colonoscopy not performed - pt reported insurance would not cover. No further bleeding. Due this year for colonoscopy.  "

## 2024-01-25 NOTE — Assessment & Plan Note (Signed)
 Continue B12 injections.

## 2024-02-18 ENCOUNTER — Ambulatory Visit: Admitting: Internal Medicine

## 2024-02-18 ENCOUNTER — Encounter: Payer: Self-pay | Admitting: Internal Medicine

## 2024-02-18 VITALS — BP 138/88 | HR 118 | Temp 98.9°F | Ht 63.0 in | Wt 174.0 lb

## 2024-02-18 DIAGNOSIS — R0981 Nasal congestion: Secondary | ICD-10-CM

## 2024-02-18 DIAGNOSIS — R053 Chronic cough: Secondary | ICD-10-CM

## 2024-02-18 LAB — NITRIC OXIDE: Nitric Oxide: 45

## 2024-02-18 MED ORDER — BUDESONIDE-FORMOTEROL FUMARATE 160-4.5 MCG/ACT IN AERO: 2.0000 | INHALATION_SPRAY | Freq: Two times a day (BID) | RESPIRATORY_TRACT | 6 refills | Status: AC

## 2024-02-18 MED ORDER — MONTELUKAST SODIUM 10 MG PO TABS: 10.0000 mg | ORAL_TABLET | Freq: Every day | ORAL | 3 refills | Status: AC

## 2024-02-18 NOTE — Patient Instructions (Signed)
 Recommend starting Symbicort  2 puffs in the morning and 2 puffs at night Please rinse mouth after use  Recommend starting Singulair  1 pill daily Recommending pulmonary function test  If symptoms do not resolve recommend ENT consultation

## 2024-02-18 NOTE — Progress Notes (Signed)
 " [facility] Consultation      Date: 02/18/2024,   MRN# [id number] Sabrina Schneider 1965-07-07   CHIEF COMPLAINT:   Assessment of chronic cough and sinus congestion   HISTORY OF PRESENT ILLNESS   59 year old pleasant female seen today for longstanding history of chronic cough over the last 2 years Has been associated with chest congestion intermittent wheezing sinus congestion and drainage  Most pressing issue is significant sinus congestion and cough  Patient states she is not allergic to anything Patient had previous history of COVID x 2 No significant inhaler history In January 2026 patient was prescribed prednisone  and antibiotics and felt better however symptoms returned  Patient is a non-smoker No secondhand smoke exposure Works with [employer] Occasional alcohol use  Patient has tried multiple antihistamines which do not help Patient has tried [employer] as nasal spray which does not help Patient does have hiatal hernia with reflux disease but is under control with Nexium    No exacerbation at this time No evidence of heart failure at this time No evidence or signs of infection at this time No respiratory distress No fevers, chills, nausea, vomiting, diarrhea No evidence of lower extremity edema No evidence hemoptysis  Chest x-ray May 2025 Independently reviewed by me today No significant abnormalities no acute cardiopulmonary issues No pneumonia no edema no effusion   Assessment of ASTHMA   Lab Results  Component Value Date   NITRICOXIDE 45 02/18/2024   Elevated exhaled Nitric oxide testing is highly consistent with type II inflammation Start inhaled corticosteroids and long-acting beta agonist inhalers    PAST MEDICAL HISTORY   Past Medical History:  Diagnosis Date   Anemia    BRCA negative    Family history of breast cancer    daughter had breast cancer at age 73-BRCA positive. Patient is BRCA negative   GERD (gastroesophageal  reflux disease)    Hiatal hernia      SURGICAL HISTORY   Past Surgical History:  Procedure Laterality Date   BREAST CYST ASPIRATION  2010/ 2018   Right breast cyst aspiration   CESAREAN SECTION  03/18/2001   fetal indications   COLONOSCOPY N/A 06/01/2014   Procedure: COLONOSCOPY;  Surgeon: Reyes LELON Cota, MD;  Location: [facility] ENDOSCOPY;  Service: Endoscopy;  Laterality: N/A;   DILATION AND CURETTAGE OF UTERUS     tab   UPPER GI ENDOSCOPY  2015   Dr. Viktoria     FAMILY HISTORY   Family History  Problem Relation Age of Onset   Lung cancer Father 62   Hypertension Mother    Diabetes Mother    Heart disease Mother    Breast cancer Daughter 60       BCRA2 mutation    Diabetes Sister    Cervical cancer Sister    Prostate cancer Maternal Grandfather 52   Diabetes Sister    Cervical cancer Sister 28   ADD / ADHD Sister      SOCIAL HISTORY   Social History[1]   MEDICATIONS    Home Medication:  Current Outpatient Rx   Order #: [id number] Class: Normal   Order #: [id number] Class: Normal   Order #: [id number] Class: Normal   Order #: [id number] Class: Normal    Current Medication: Current Medications[2]    ALLERGIES   Patient has no known allergies.   LMP 10/10/2016  BP 138/88   Pulse (!) 118   Temp 98.9 F (37.2 C)   Ht 5' 3 (1.6 m)   Wt 174 lb (  78.9 kg)   LMP 10/10/2016   SpO2 96%   BMI 30.82 kg/m    Review of Systems: Gen:  Denies  fever, sweats, chills weight loss  HEENT: Denies blurred vision, double vision, ear pain, eye pain, hearing loss, nose bleeds, sore throat Cardiac:  No dizziness, chest pain or heaviness, chest tightness,edema, No JVD Resp:  + cough, -sputum production, +shortness of breath,+wheezing, -hemoptysis,  Other:  All other systems negative   Physical Examination:   General Appearance: No distress  EYES PERRLA, EOM intact.   NECK Supple, No JVD Pulmonary: normal breath sounds, No wheezing.  CardiovascularNormal S1,S2.   No m/r/g.   Abdomen: Benign, Soft, non-tender. Neurology UE/LE 5/5 strength, no focal deficits Ext pulses intact, cap refill intact ALL OTHER ROS ARE NEGATIVE       ASSESSMENT/PLAN   59 year old pleasant female seen today for ongoing symptoms of chronic cough chest congestion sinus drainage sinus congestion likely related to underlying reactive airways disease asthma with underlying chronic allergic rhinitis  Asthma- reactive airways disease FeNO 46 Recommend starting Symbicort  inhaler rinse mouth after use  Allergic rhinitis Start Singulair   Avoid Allergens and Irritants Avoid secondhand smoke Avoid SICK contacts Recommend  Masking  when appropriate Recommend Keep up-to-date with vaccinations   MEDICATION ADJUSTMENTS/LABS AND TESTS ORDERED: Symbicort  Singulair    CURRENT MEDICATIONS REVIEWED AT LENGTH WITH PATIENT TODAY   Patient  satisfied with Plan of action and management. All questions answered   Follow up 4 weeks   I spent a total of 65 minutes dedicated to the care of this patient on the date of this encounter to include pre-visit review of records, face-to-face time with the patient discussing conditions above, post visit ordering of testing, clinical documentation with the electronic health record, making appropriate referrals as documented, and communicating necessary information to the patient's healthcare team.    The Patient requires high complexity decision making for assessment and support, frequent evaluation and titration of therapies, application of advanced monitoring technologies and extensive interpretation of multiple databases.  Patient satisfied with Plan of action and management. All questions answered    Nickolas Alm Cellar, M.D.  [facility]  Medical Director [facility]                 [1]  Social History Tobacco Use   Smoking status: Never   Smokeless tobacco: Never  Vaping Use    Vaping status: Never Used  Substance Use Topics   Alcohol use: Yes    Alcohol/week: 0.0 standard drinks of alcohol    Comment: occasionally   Drug use: No  [2]  Current Outpatient Medications:    cefdinir  (OMNICEF ) 300 MG capsule, Take 1 capsule (300 mg total) by mouth 2 (two) times daily., Disp: 14 capsule, Rfl: 0   esomeprazole  (NEXIUM ) 40 MG packet, MIX THE CONTENTS OF 1 PACKET  WITH TABLESPOONSFUL WATER WAIT 2 TO 3 MINUTES THEN DRINK DAILY  BEFORE BREAKFAST, Disp: 90 each, Rfl: 3   predniSONE  (DELTASONE ) 10 MG tablet, Take 4 tablets x 1 day and then decrease by 1/2 tablet per day until down to zero mg., Disp: 18 tablet, Rfl: 0   Vitamin D , Ergocalciferol , (DRISDOL ) 1.25 MG (50000 UNIT) CAPS capsule, Take 1 capsule (50,000 Units total) by mouth every 7 (seven) days., Disp: 12 capsule, Rfl: 1  "

## 2024-03-16 ENCOUNTER — Ambulatory Visit: Admitting: Internal Medicine

## 2024-03-19 ENCOUNTER — Ambulatory Visit: Admitting: Internal Medicine
